# Patient Record
Sex: Female | Born: 1980
Health system: Southern US, Community
[De-identification: ages and names within clinical notes are randomized; demographics above are authoritative.]

## PROBLEM LIST (undated history)

## (undated) DIAGNOSIS — O139 Gestational [pregnancy-induced] hypertension without significant proteinuria, unspecified trimester: Secondary | ICD-10-CM

## (undated) DIAGNOSIS — G4730 Sleep apnea, unspecified: Secondary | ICD-10-CM

## (undated) DIAGNOSIS — E669 Obesity, unspecified: Secondary | ICD-10-CM

## (undated) DIAGNOSIS — J45909 Unspecified asthma, uncomplicated: Secondary | ICD-10-CM

## (undated) DIAGNOSIS — G4733 Obstructive sleep apnea (adult) (pediatric): Secondary | ICD-10-CM

## (undated) DIAGNOSIS — T7840XA Allergy, unspecified, initial encounter: Secondary | ICD-10-CM

## (undated) DIAGNOSIS — D219 Benign neoplasm of connective and other soft tissue, unspecified: Secondary | ICD-10-CM

## (undated) HISTORY — DX: Unspecified asthma, uncomplicated: J45.909

## (undated) HISTORY — DX: Obesity, unspecified: E66.9

## (undated) HISTORY — PX: CERVICAL CERCLAGE: SHX1329

## (undated) HISTORY — PX: DILATION AND CURETTAGE OF UTERUS: SHX78

## (undated) HISTORY — DX: Obstructive sleep apnea (adult) (pediatric): G47.33

## (undated) HISTORY — PX: OTHER SURGICAL HISTORY: SHX169

## (undated) HISTORY — DX: Sleep apnea, unspecified: G47.30

## (undated) HISTORY — DX: Allergy, unspecified, initial encounter: T78.40XA

---

## 2007-11-25 ENCOUNTER — Emergency Department: Payer: Self-pay | Admitting: Internal Medicine

## 2009-06-20 ENCOUNTER — Emergency Department (HOSPITAL_COMMUNITY): Admission: EM | Admit: 2009-06-20 | Discharge: 2009-06-20 | Payer: Self-pay | Admitting: Emergency Medicine

## 2014-03-28 HISTORY — PX: MYOMECTOMY: SHX85

## 2014-04-24 ENCOUNTER — Other Ambulatory Visit (HOSPITAL_COMMUNITY): Payer: Self-pay | Admitting: Obstetrics & Gynecology

## 2014-04-24 DIAGNOSIS — N979 Female infertility, unspecified: Secondary | ICD-10-CM

## 2014-04-28 ENCOUNTER — Ambulatory Visit (HOSPITAL_COMMUNITY)
Admission: RE | Admit: 2014-04-28 | Discharge: 2014-04-28 | Disposition: A | Discharge status: Home / Self Care | Payer: BLUE CROSS/BLUE SHIELD | Source: Ambulatory Visit | Attending: Obstetrics & Gynecology | Admitting: Obstetrics & Gynecology

## 2014-04-28 DIAGNOSIS — D259 Leiomyoma of uterus, unspecified: Secondary | ICD-10-CM | POA: Diagnosis not present

## 2014-04-28 DIAGNOSIS — N921 Excessive and frequent menstruation with irregular cycle: Secondary | ICD-10-CM | POA: Insufficient documentation

## 2014-04-28 DIAGNOSIS — N979 Female infertility, unspecified: Secondary | ICD-10-CM

## 2014-04-28 MED ORDER — IOHEXOL 300 MG/ML  SOLN
20.0000 mL | Freq: Once | INTRAMUSCULAR | Status: AC | PRN
  Administered 2014-04-28: 20 mL

## 2014-10-15 ENCOUNTER — Other Ambulatory Visit: Payer: Self-pay | Admitting: Obstetrics and Gynecology

## 2015-05-12 ENCOUNTER — Encounter: Payer: Self-pay | Admitting: Family Medicine

## 2015-05-12 DIAGNOSIS — J45909 Unspecified asthma, uncomplicated: Secondary | ICD-10-CM | POA: Insufficient documentation

## 2015-05-13 ENCOUNTER — Ambulatory Visit: Payer: Self-pay | Admitting: Family Medicine

## 2015-06-02 ENCOUNTER — Encounter: Payer: Self-pay | Admitting: Family Medicine

## 2015-06-02 ENCOUNTER — Ambulatory Visit (INDEPENDENT_AMBULATORY_CARE_PROVIDER_SITE_OTHER): Payer: BLUE CROSS/BLUE SHIELD | Admitting: Family Medicine

## 2015-06-02 VITALS — BP 134/70 | HR 72 | Temp 98.9°F | Resp 12 | Ht 67.5 in | Wt 315.0 lb

## 2015-06-02 DIAGNOSIS — Z9109 Other allergy status, other than to drugs and biological substances: Secondary | ICD-10-CM

## 2015-06-02 DIAGNOSIS — Z91048 Other nonmedicinal substance allergy status: Secondary | ICD-10-CM | POA: Diagnosis not present

## 2015-06-02 DIAGNOSIS — J4520 Mild intermittent asthma, uncomplicated: Secondary | ICD-10-CM

## 2015-06-02 DIAGNOSIS — Z0000 Encounter for general adult medical examination without abnormal findings: Secondary | ICD-10-CM | POA: Diagnosis not present

## 2015-06-02 DIAGNOSIS — E669 Obesity, unspecified: Secondary | ICD-10-CM | POA: Insufficient documentation

## 2015-06-02 MED ORDER — NALTREXONE-BUPROPION HCL ER 8-90 MG PO TB12: 2.0000 | ORAL_TABLET | Freq: Two times a day (BID) | ORAL | Status: DC

## 2015-06-02 MED ORDER — AZELASTINE HCL 0.1 % NA SOLN: 2.0000 | Freq: Two times a day (BID) | NASAL | Status: DC

## 2015-06-02 MED ORDER — MONTELUKAST SODIUM 10 MG PO TABS: 10.0000 mg | ORAL_TABLET | Freq: Every day | ORAL | Status: DC

## 2015-06-02 MED ORDER — FLUTICASONE PROPIONATE 50 MCG/ACT NA SUSP: 2.0000 | Freq: Every day | NASAL | Status: DC

## 2015-06-02 MED ORDER — EPINEPHRINE 0.3 MG/0.3ML IJ SOAJ: 0.3000 mg | Freq: Once | INTRAMUSCULAR | Status: DC

## 2015-06-02 NOTE — Patient Instructions (Addendum)
Release of records- [facility]  For Contrave-  Take 1 pill in the morning for 1 week Then 1 pill twice a day for 1 week  Then 2 pill in morning and 1 in evening for 1 ween Then 2 pills in morning and 2 in evening --stay at this dose  Singuilar for allergies  F/U 8 weeks

## 2015-06-02 NOTE — Progress Notes (Signed)
Patient ID: Carla [name], female   DOB: 1980-06-28, 35 y.o.   MRN: [id number]   Subjective:    Patient ID: Carla [name], female    DOB: 02-01-1981, 35 y.o.   MRN: [id number]  Patient presents for New Patient ~ Establish Care and Discuss Weight  patient to establish care. She has a GYN at [facility]. She's had a myomectomy done secondary to fibroids and heavy bleeding.  Her previous primary care provider within [place] that she did not follow with them regularly. She does have a history of asthma and allergies uses albuterol as needed she does use the antihistamines however these cause her to be very sleepy during the daytime. Most the time she uses Flonase or Astelin spray. Her main concern today is her weight. She's had difficulty with her weight most of her adult life. During high school in her early 62s she was about 160 270 pounds. After she had her daughter is when her weight started to increase around this time she was about 35 years old. She was treated with phentermine by her previous primary care provider but she does not recall why she went off of the medication. She does remember feeling the stimulant effects. 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 and she is also getting her PhD in biology so therefore her time is very restrained. Previously she was commuting an hour to work and back home in the morning and evenings. She admits that she has not been exercising or eating very healthy. She is I some help with her weight loss.    Review Of Systems:  GEN- denies fatigue, fever, weight loss,weakness, recent illness HEENT- denies eye drainage, change in vision, nasal discharge, CVS- denies chest pain, palpitations RESP- denies SOB, cough, wheeze ABD- denies N/V, change in stools, abd pain GU- denies dysuria, hematuria, dribbling, incontinence MSK- denies joint pain, muscle aches, injury Neuro- denies headache, dizziness, syncope, seizure activity       Objective:    BP 134/70  mmHg  Pulse 72  Temp(Src) 98.9 F (37.2 C) (Oral)  Resp 12  Ht 5' 7.5" (1.715 m)  Wt 315 lb (142.883 kg)  BMI 48.58 kg/m2  LMP 05/07/2015 (Approximate) GEN- NAD, alert and oriented x3,obese HEENT- PERRL, EOMI, non injected sclera, pink conjunctiva, MMM, oropharynx clear Neck- Supple, no thyromegaly CVS- RRR, no murmur RESP-CTAB ABD-NABS,soft,NT,ND Psych- Normal affect and mood EXT- No edema Pulses- Radial, DP- 2+        Assessment & Plan:      Problem List Items Addressed This Visit    None      Note: This dictation was prepared with Dragon dictation along with smaller phrase technology. Any transcriptional errors that result from this process are unintentional.

## 2015-06-03 ENCOUNTER — Encounter: Payer: Self-pay | Admitting: Family Medicine

## 2015-06-03 LAB — COMPREHENSIVE METABOLIC PANEL
ALK PHOS: 62 U/L (ref 33–115)
ALT: 17 U/L (ref 6–29)
AST: 17 U/L (ref 10–30)
Albumin: 4 g/dL (ref 3.6–5.1)
BUN: 11 mg/dL (ref 7–25)
CALCIUM: 9.2 mg/dL (ref 8.6–10.2)
CO2: 27 mmol/L (ref 20–31)
Chloride: 105 mmol/L (ref 98–110)
Creat: 0.78 mg/dL (ref 0.50–1.10)
Glucose, Bld: 80 mg/dL (ref 70–99)
POTASSIUM: 4.8 mmol/L (ref 3.5–5.3)
Sodium: 138 mmol/L (ref 135–146)
TOTAL PROTEIN: 6.7 g/dL (ref 6.1–8.1)
Total Bilirubin: 0.2 mg/dL (ref 0.2–1.2)

## 2015-06-03 LAB — CBC WITH DIFFERENTIAL/PLATELET
BASOS ABS: 0.1 10*3/uL (ref 0.0–0.1)
Basophils Relative: 1 % (ref 0–1)
Eosinophils Absolute: 0.1 10*3/uL (ref 0.0–0.7)
Eosinophils Relative: 2 % (ref 0–5)
HEMATOCRIT: 37.8 % (ref 36.0–46.0)
HEMOGLOBIN: 12.2 g/dL (ref 12.0–15.0)
Lymphocytes Relative: 34 % (ref 12–46)
Lymphs Abs: 1.7 10*3/uL (ref 0.7–4.0)
MCH: 29.6 pg (ref 26.0–34.0)
MCHC: 32.3 g/dL (ref 30.0–36.0)
MCV: 91.7 fL (ref 78.0–100.0)
MONOS PCT: 7 % (ref 3–12)
MPV: 10.2 fL (ref 8.6–12.4)
Monocytes Absolute: 0.4 10*3/uL (ref 0.1–1.0)
Neutro Abs: 2.8 10*3/uL (ref 1.7–7.7)
Neutrophils Relative %: 56 % (ref 43–77)
Platelets: 363 10*3/uL (ref 150–400)
RBC: 4.12 MIL/uL (ref 3.87–5.11)
RDW: 14.9 % (ref 11.5–15.5)
WBC: 5 10*3/uL (ref 4.0–10.5)

## 2015-06-03 LAB — TSH: TSH: 2.34 m[IU]/L

## 2015-06-03 NOTE — Assessment & Plan Note (Signed)
Trial of singulair, continue albuterol as needed

## 2015-06-03 NOTE — Assessment & Plan Note (Signed)
Multiple environmental allergens  Continue nasal spray. We'll try her on Singulair she has failed multiple antihistamines and they cause sedation.  She has EpiPen

## 2015-06-03 NOTE — Assessment & Plan Note (Signed)
His best weight loss options. I think she will be a good candidate for the Contrave discussed the side effects and how to taper this medication up. I will check basic labs to make sure those are normal. We will obtain her records from her GYN

## 2015-06-08 ENCOUNTER — Telehealth: Payer: Self-pay | Admitting: *Deleted

## 2015-06-08 NOTE — Telephone Encounter (Signed)
Received request from pharmacy for PA on Contrave.   PA submitted.   Dx: E66.01- obesity. BMI 48.7.

## 2015-06-09 NOTE — Telephone Encounter (Signed)
Received PA determination.   PA approved 06/08/2015- 10/06/2015.  Case #: [id number].  Pharmacy made aware.

## 2015-07-29 ENCOUNTER — Encounter: Payer: Self-pay | Admitting: Family Medicine

## 2015-07-29 ENCOUNTER — Ambulatory Visit (INDEPENDENT_AMBULATORY_CARE_PROVIDER_SITE_OTHER): Payer: BLUE CROSS/BLUE SHIELD | Admitting: Family Medicine

## 2015-07-29 MED ORDER — PHENTERMINE HCL 37.5 MG PO TABS: 37.5000 mg | ORAL_TABLET | Freq: Every day | ORAL | Status: DC

## 2015-07-29 NOTE — Progress Notes (Signed)
Patient ID: Carla [name], female   DOB: [birth year], 35 y.o.   MRN: [id number]    Subjective:    Patient ID: Carla [name], female    DOB: 03-25-1981, 35 y.o.   MRN: [id number]  Patient presents for 2 month F/U  Patient here for interim follow-up on obesity. She was started on contrary at the end of March however she has side effects with the medication discontinued after 3 days made her feel like she was going to pass out should a lot of congestion headache with the medication. She will like to try another medication she was on phentermine in the past but states after recalling she had some mild side effects from the stimulant where she felt jittery but she prefers to try this medication again. She is trying to watch her diet and trying to be active.   Review Of Systems:  GEN- denies fatigue, fever, weight loss,weakness, recent illness HEENT- denies eye drainage, change in vision, nasal discharge, CVS- denies chest pain, palpitations RESP- denies SOB, cough, wheeze ABD- denies N/V, change in stools, abd pain GU- denies dysuria, hematuria, dribbling, incontinence MSK- denies joint pain, muscle aches, injury Neuro- denies headache, dizziness, syncope, seizure activity       Objective:    BP 120/66 mmHg  Pulse 70  Temp(Src) 98.7 F (37.1 C) (Oral)  Resp 12  Ht 5\' 8"  (1.727 m)  Wt 312 lb (141.522 kg)  BMI 47.45 kg/m2  LMP 07/08/2015 (Approximate) GEN- NAD, alert and oriented x3 CVS- RRR, no murmur RESP-CTAB EXT- No edema Psych- normal affect and mood Pulses- Radial2+        Assessment & Plan:      Problem List Items Addressed This Visit    Morbid obesity ([place]) - Primary    We'll try phentermine was started with one half tablet daily for 2 weeks and increase to 1 full tablet. Discussed the side effects of the medication. Continue low-carb low-fat diet and try to incorporate aerobic exercise 5 days a week. Follow-up in 3 months for weight      Relevant Medications   phentermine (ADIPEX-P) 37.5 MG tablet      Note: This dictation was prepared with Dragon dictation along with smaller phrase technology. Any transcriptional errors that result from this process are unintentional.

## 2015-07-29 NOTE — Patient Instructions (Signed)
Try phetermine take 1/2 tablet for 2 weeks F/U 3 months

## 2015-07-29 NOTE — Assessment & Plan Note (Signed)
We'll try phentermine was started with one half tablet daily for 2 weeks and increase to 1 full tablet. Discussed the side effects of the medication. Continue low-carb low-fat diet and try to incorporate aerobic exercise 5 days a week. Follow-up in 3 months for weight

## 2015-09-08 ENCOUNTER — Telehealth: Payer: Self-pay | Admitting: Family Medicine

## 2015-09-08 ENCOUNTER — Ambulatory Visit (INDEPENDENT_AMBULATORY_CARE_PROVIDER_SITE_OTHER): Payer: BLUE CROSS/BLUE SHIELD | Admitting: Family Medicine

## 2015-09-08 ENCOUNTER — Encounter: Payer: Self-pay | Admitting: Family Medicine

## 2015-09-08 VITALS — BP 138/72 | HR 88 | Temp 98.9°F | Resp 14 | Ht 68.0 in | Wt 314.0 lb

## 2015-09-08 DIAGNOSIS — H60391 Other infective otitis externa, right ear: Secondary | ICD-10-CM

## 2015-09-08 MED ORDER — NEOMYCIN-POLYMYXIN-HC 3.5-10000-1 OT SOLN: 3.0000 [drp] | Freq: Four times a day (QID) | OTIC | Status: DC

## 2015-09-08 NOTE — Telephone Encounter (Signed)
Pt went to pick up her medication for her ear and it was almost $50. She is hoping that we can call in another medication that may be cheaper.

## 2015-09-08 NOTE — Telephone Encounter (Signed)
Call placed to pharmacy.   Was advised that prescription is the cheapest on the market.   MD please advise.

## 2015-09-08 NOTE — Telephone Encounter (Signed)
Call placed to patient. LMTRC.  

## 2015-09-08 NOTE — Patient Instructions (Signed)
Use ear drops as prescribed  F/U as previous

## 2015-09-08 NOTE — Telephone Encounter (Signed)
Okay to change to eye drops if cheaper

## 2015-09-08 NOTE — Progress Notes (Signed)
Patient ID: Carla [name], female   DOB: 1980/12/24, 35 y.o.   MRN: [id number]   Subjective:    Patient ID: Carla [name], female    DOB: 1981/01/21, 35 y.o.   MRN: [id number]  Patient presents for Illness Patient here with right ear pain worsening over the past 2 weeks. She is able to hear is normal. She's not had any drainage feels like her ears very irritated. Initially started with some itching in the ear. She did have a stomach bug couple weeks ago but that resolved. She has not had any sinus pressure or drainage she uses her regular allergy medicines and her asthma medicine as needed. She has not had any fever  She still working on weight loss. She is now on phentermine 1 tablet daily she is planning to incorporate a exercise program Review Of Systems:  GEN- denies fatigue, fever, weight loss,weakness, recent illness HEENT- denies eye drainage, change in vision, nasal discharge, CVS- denies chest pain, palpitations RESP- denies SOB, cough, wheeze ABD- denies N/V, change in stools, abd pain Neuro- denies headache, dizziness, syncope, seizure activity       Objective:    BP 138/72 mmHg  Pulse 88  Temp(Src) 98.9 F (37.2 C) (Oral)  Resp 14  Ht 5\' 8"  (1.727 m)  Wt 314 lb (142.429 kg)  BMI 47.75 kg/m2 GEN- NAD, alert and oriented x3 HEENT- PERRL, EOMI, non injected sclera, pink conjunctiva, MMM, oropharynx clear , TM clear bilat no effusion, injected right canal, no swelling no discharge, Tender to touch and with manipulation of pinna  No maxillary sinus tenderness,nares clear Neck- Supple, no LAD           Assessment & Plan:      Problem List Items Addressed This Visit    None    Visit Diagnoses    Otitis, externa, infective, right    -  Primary    cortisporin drops as prescribed       Note: This dictation was prepared with Dragon dictation along with smaller phrase technology. Any transcriptional errors that result from this process are unintentional.

## 2015-09-09 NOTE — Telephone Encounter (Signed)
Multiple calls placed to patient with no answer and no return call.   Message to be closed.  

## 2015-11-06 ENCOUNTER — Ambulatory Visit: Payer: BLUE CROSS/BLUE SHIELD | Admitting: Family Medicine

## 2016-03-03 LAB — OB RESULTS CONSOLE GC/CHLAMYDIA
Chlamydia: NEGATIVE
Gonorrhea: NEGATIVE

## 2016-03-23 ENCOUNTER — Encounter (HOSPITAL_COMMUNITY): Payer: Self-pay | Admitting: *Deleted

## 2016-03-24 ENCOUNTER — Encounter (HOSPITAL_COMMUNITY): Payer: Self-pay | Admitting: *Deleted

## 2016-04-04 LAB — OB RESULTS CONSOLE ANTIBODY SCREEN: Antibody Screen: NEGATIVE

## 2016-04-04 LAB — OB RESULTS CONSOLE HEPATITIS B SURFACE ANTIGEN: Hepatitis B Surface Ag: NEGATIVE

## 2016-04-04 LAB — OB RESULTS CONSOLE RPR: RPR: NONREACTIVE

## 2016-04-04 LAB — OB RESULTS CONSOLE ABO/RH: RH TYPE: POSITIVE

## 2016-04-04 LAB — OB RESULTS CONSOLE RUBELLA ANTIBODY, IGM: RUBELLA: IMMUNE

## 2016-04-04 LAB — OB RESULTS CONSOLE HIV ANTIBODY (ROUTINE TESTING): HIV: NONREACTIVE

## 2016-04-12 ENCOUNTER — Encounter (HOSPITAL_COMMUNITY): Payer: Self-pay | Admitting: Anesthesiology

## 2016-04-12 ENCOUNTER — Ambulatory Visit (HOSPITAL_COMMUNITY): Payer: Commercial Managed Care - PPO | Admitting: Anesthesiology

## 2016-04-12 ENCOUNTER — Ambulatory Visit (HOSPITAL_COMMUNITY)
Admission: RE | Admit: 2016-04-12 | Discharge: 2016-04-12 | Disposition: A | Discharge status: Home / Self Care | Payer: Commercial Managed Care - PPO | Source: Ambulatory Visit | Attending: Obstetrics and Gynecology | Admitting: Obstetrics and Gynecology

## 2016-04-12 ENCOUNTER — Ambulatory Visit (HOSPITAL_COMMUNITY): Payer: Commercial Managed Care - PPO

## 2016-04-12 ENCOUNTER — Encounter (HOSPITAL_COMMUNITY)
Admission: RE | Disposition: A | Discharge status: Home / Self Care | Payer: Self-pay | Source: Ambulatory Visit | Attending: Obstetrics and Gynecology

## 2016-04-12 DIAGNOSIS — O99511 Diseases of the respiratory system complicating pregnancy, first trimester: Secondary | ICD-10-CM | POA: Insufficient documentation

## 2016-04-12 DIAGNOSIS — E739 Lactose intolerance, unspecified: Secondary | ICD-10-CM | POA: Insufficient documentation

## 2016-04-12 DIAGNOSIS — O3431 Maternal care for cervical incompetence, first trimester: Secondary | ICD-10-CM | POA: Diagnosis present

## 2016-04-12 DIAGNOSIS — Z886 Allergy status to analgesic agent status: Secondary | ICD-10-CM | POA: Insufficient documentation

## 2016-04-12 DIAGNOSIS — J45909 Unspecified asthma, uncomplicated: Secondary | ICD-10-CM | POA: Diagnosis not present

## 2016-04-12 DIAGNOSIS — N883 Incompetence of cervix uteri: Secondary | ICD-10-CM

## 2016-04-12 DIAGNOSIS — Z6841 Body Mass Index (BMI) 40.0 and over, adult: Secondary | ICD-10-CM | POA: Diagnosis not present

## 2016-04-12 DIAGNOSIS — Z880 Allergy status to penicillin: Secondary | ICD-10-CM | POA: Insufficient documentation

## 2016-04-12 DIAGNOSIS — O09211 Supervision of pregnancy with history of pre-term labor, first trimester: Secondary | ICD-10-CM

## 2016-04-12 DIAGNOSIS — Z98891 History of uterine scar from previous surgery: Secondary | ICD-10-CM

## 2016-04-12 DIAGNOSIS — O09891 Supervision of other high risk pregnancies, first trimester: Secondary | ICD-10-CM

## 2016-04-12 DIAGNOSIS — Z3A12 12 weeks gestation of pregnancy: Secondary | ICD-10-CM | POA: Diagnosis not present

## 2016-04-12 DIAGNOSIS — Z9889 Other specified postprocedural states: Secondary | ICD-10-CM | POA: Insufficient documentation

## 2016-04-12 DIAGNOSIS — O3430 Maternal care for cervical incompetence, unspecified trimester: Secondary | ICD-10-CM

## 2016-04-12 HISTORY — PX: CERVICAL CERCLAGE: SHX1329

## 2016-04-12 HISTORY — DX: Benign neoplasm of connective and other soft tissue, unspecified: D21.9

## 2016-04-12 LAB — CBC
HEMATOCRIT: 36 % (ref 36.0–46.0)
HEMOGLOBIN: 12.6 g/dL (ref 12.0–15.0)
MCH: 31.8 pg (ref 26.0–34.0)
MCHC: 35 g/dL (ref 30.0–36.0)
MCV: 90.9 fL (ref 78.0–100.0)
Platelets: 274 10*3/uL (ref 150–400)
RBC: 3.96 MIL/uL (ref 3.87–5.11)
RDW: 14.5 % (ref 11.5–15.5)
WBC: 6.3 10*3/uL (ref 4.0–10.5)

## 2016-04-12 SURGERY — CERCLAGE, CERVIX, VAGINAL APPROACH
Anesthesia: Spinal | Site: Vagina

## 2016-04-12 MED ORDER — LACTATED RINGERS IV SOLN
INTRAVENOUS | Status: DC
  Administered 2016-04-12: 09:00:00 via INTRAVENOUS
  Administered 2016-04-12: 125 mL/h via INTRAVENOUS

## 2016-04-12 MED ORDER — HYDROCODONE-ACETAMINOPHEN 7.5-325 MG PO TABS: 1.0000 | ORAL_TABLET | Freq: Once | ORAL | Status: DC | PRN

## 2016-04-12 MED ORDER — FENTANYL CITRATE (PF) 100 MCG/2ML IJ SOLN
INTRAMUSCULAR | Status: AC
  Filled 2016-04-12: qty 2

## 2016-04-12 MED ORDER — LIDOCAINE-EPINEPHRINE 1 %-1:100000 IJ SOLN
INTRAMUSCULAR | Status: DC | PRN
  Administered 2016-04-12: 3 mL

## 2016-04-12 MED ORDER — MEPERIDINE HCL 25 MG/ML IJ SOLN: 6.2500 mg | INTRAMUSCULAR | Status: DC | PRN

## 2016-04-12 MED ORDER — FENTANYL CITRATE (PF) 100 MCG/2ML IJ SOLN
25.0000 ug | INTRAMUSCULAR | Status: DC | PRN
  Administered 2016-04-12: 25 ug via INTRAVENOUS

## 2016-04-12 MED ORDER — METOCLOPRAMIDE HCL 5 MG/ML IJ SOLN: 10.0000 mg | Freq: Once | INTRAMUSCULAR | Status: DC | PRN

## 2016-04-12 MED ORDER — INDOMETHACIN 50 MG PO CAPS: 50.0000 mg | ORAL_CAPSULE | Freq: Two times a day (BID) | ORAL | 0 refills | Status: DC

## 2016-04-12 MED ORDER — BUPIVACAINE IN DEXTROSE 0.75-8.25 % IT SOLN
INTRATHECAL | Status: DC | PRN
  Administered 2016-04-12: 1.2 mL via INTRATHECAL

## 2016-04-12 MED ORDER — LIDOCAINE-EPINEPHRINE 1 %-1:100000 IJ SOLN
INTRAMUSCULAR | Status: AC
  Filled 2016-04-12: qty 1

## 2016-04-12 MED ORDER — INDOMETHACIN 50 MG PO CAPS
50.0000 mg | ORAL_CAPSULE | Freq: Two times a day (BID) | ORAL | Status: DC
Start: 2016-04-12 — End: 2016-04-12
  Administered 2016-04-12: 50 mg via ORAL
  Filled 2016-04-12: qty 1

## 2016-04-12 MED ORDER — FENTANYL CITRATE (PF) 100 MCG/2ML IJ SOLN
INTRAMUSCULAR | Status: DC | PRN
  Administered 2016-04-12 (×2): 50 ug via INTRAVENOUS

## 2016-04-12 MED ORDER — ONDANSETRON HCL 4 MG/2ML IJ SOLN
INTRAMUSCULAR | Status: DC | PRN
  Administered 2016-04-12: 4 mg via INTRAVENOUS

## 2016-04-12 SURGICAL SUPPLY — 25 items
BLADE SURG 11 STRL SS (BLADE) ×2 IMPLANT
CANISTER SUCT 3000ML (MISCELLANEOUS) ×2 IMPLANT
CATH FOLEY 2WAY SLVR 30CC 16FR (CATHETERS) IMPLANT
CLOTH BEACON ORANGE TIMEOUT ST (SAFETY) ×2 IMPLANT
COUNTER NEEDLE 1200 MAGNETIC (NEEDLE) ×2 IMPLANT
ELECT REM PT RETURN 9FT ADLT (ELECTROSURGICAL) ×2
ELECTRODE REM PT RTRN 9FT ADLT (ELECTROSURGICAL) ×1 IMPLANT
GLOVE BIO SURGEON STRL SZ7 (GLOVE) ×2 IMPLANT
GLOVE BIOGEL PI IND STRL 7.0 (GLOVE) ×1 IMPLANT
GLOVE BIOGEL PI INDICATOR 7.0 (GLOVE) ×1
GOWN STRL REUS W/TWL LRG LVL3 (GOWN DISPOSABLE) ×4 IMPLANT
NEEDLE MAYO CATGUT SZ4 (NEEDLE) ×2 IMPLANT
NS IRRIG 1000ML POUR BTL (IV SOLUTION) ×2 IMPLANT
PACK VAGINAL MINOR WOMEN LF (CUSTOM PROCEDURE TRAY) ×2 IMPLANT
PAD OB MATERNITY 4.3X12.25 (PERSONAL CARE ITEMS) ×2 IMPLANT
PAD PREP 24X48 CUFFED NSTRL (MISCELLANEOUS) ×2 IMPLANT
PENCIL BUTTON HOLSTER BLD 10FT (ELECTRODE) ×2 IMPLANT
SUT TICRON 2 BLUE 36 GS-21 (SUTURE) ×4 IMPLANT
SUT VICRYL 3 0 RAPIDE (SUTURE) ×4 IMPLANT
SYR 30ML LL (SYRINGE) IMPLANT
TOWEL OR 17X24 6PK STRL BLUE (TOWEL DISPOSABLE) ×4 IMPLANT
TRAY FOLEY CATH SILVER 14FR (SET/KITS/TRAYS/PACK) ×2 IMPLANT
TUBING NON-CON 1/4 X 20 CONN (TUBING) ×2 IMPLANT
WATER STERILE IRR 1000ML POUR (IV SOLUTION) IMPLANT
YANKAUER SUCT BULB TIP NO VENT (SUCTIONS) ×2 IMPLANT

## 2016-04-12 NOTE — Anesthesia Procedure Notes (Signed)
Spinal  Patient location during procedure: OR Start time: 04/12/2016 9:29 AM Staffing Anesthesiologist: Josephine Igo Preanesthetic Checklist Completed: patient identified, site marked, surgical consent, pre-op evaluation, timeout performed, IV checked, risks and benefits discussed and monitors and equipment checked Spinal Block Patient position: sitting Prep: site prepped and draped and DuraPrep Patient monitoring: heart rate, cardiac monitor, continuous pulse ox and blood pressure Approach: midline Location: L3-4 Injection technique: single-shot Needle Needle type: Pencan and Sprotte  Needle gauge: 24 G Needle length: 12.7 cm Needle insertion depth: 11 cm Assessment Sensory level: T10 Additional Notes Patient tolerated procedure well. Adequate sensory block. Attempt x 2. Needed to use 12cm Sprotte to reach Intrathecal space.

## 2016-04-12 NOTE — Anesthesia Postprocedure Evaluation (Signed)
Anesthesia Post Note  Patient: Carla [name]  Procedure(s) Performed: Procedure(s) (LRB): CERCLAGE CERVICAL (N/A)  Patient location during evaluation: PACU Anesthesia Type: Spinal Level of consciousness: oriented and awake and alert Pain management: pain level controlled Vital Signs Assessment: post-procedure vital signs reviewed and stable Respiratory status: spontaneous breathing, respiratory function stable and nonlabored ventilation Cardiovascular status: blood pressure returned to baseline and stable Postop Assessment: no headache, no backache, spinal receding, patient able to bend at knees and no signs of nausea or vomiting Anesthetic complications: no        Last Vitals:  Vitals:   04/12/16 1100 04/12/16 1115  BP:  (!) 145/80  Pulse: 68 77  Resp: 20 (!) 23  Temp:      Last Pain:  Vitals:   04/12/16 1115  TempSrc:   PainSc: 0-No pain   Pain Goal: Patients Stated Pain Goal: 2 (04/12/16 1115)               Sadi Arave A.

## 2016-04-12 NOTE — Anesthesia Preprocedure Evaluation (Addendum)
Anesthesia Evaluation  Patient identified by MRN, date of birth, ID band Patient awake    Reviewed: Allergy & Precautions, NPO status , Patient's Chart, lab work & pertinent test results  Airway Mallampati: III  TM Distance: >3 FB Neck ROM: Full    Dental no notable dental hx. (+) Teeth Intact   Pulmonary asthma ,    Pulmonary exam normal breath sounds clear to auscultation- rhonchi       Cardiovascular negative cardio ROS Normal cardiovascular exam Rhythm:Regular Rate:Normal     Neuro/Psych negative neurological ROS  negative psych ROS   GI/Hepatic negative GI ROS, Neg liver ROS,   Endo/Other  Morbid obesity  Renal/GU negative Renal ROS  negative genitourinary   Musculoskeletal negative musculoskeletal ROS (+)   Abdominal (+) + obese,   Peds  Hematology negative hematology ROS (+)   Anesthesia Other Findings   Reproductive/Obstetrics (+) Pregnancy Incompetent Cervix Hx/o Myomectomy                            Anesthesia Physical Anesthesia Plan  ASA: III  Anesthesia Plan: Spinal   Post-op Pain Management:    Induction:   Airway Management Planned: Natural Airway  Additional Equipment:   Intra-op Plan:   Post-operative Plan:   Informed Consent: I have reviewed the patients History and Physical, chart, labs and discussed the procedure including the risks, benefits and alternatives for the proposed anesthesia with the patient or authorized representative who has indicated his/her understanding and acceptance.   Dental advisory given  Plan Discussed with: CRNA, Anesthesiologist and Surgeon  Anesthesia Plan Comments:         Anesthesia Quick Evaluation

## 2016-04-12 NOTE — Brief Op Note (Signed)
04/12/2016  10:16 AM  PATIENT:  Carla [name]  36 y.o. female  PRE-OPERATIVE DIAGNOSIS:  INCOMPETENT CERVIX  POST-OPERATIVE DIAGNOSIS:  incompetent cervix  PROCEDURE:  Procedure(s): CERCLAGE CERVICAL (N/A)  SURGEON:  Surgeon(s) and Role:    * Bobbye Charleston, MD - Primary  ANESTHESIA:   spinal  EBL:  No intake/output data recorded.  LOCAL MEDICATIONS USED:  LIDOCAINE   SPECIMEN:  No Specimen  DISPOSITION OF SPECIMEN:  N/A  COUNTS:  YES  TOURNIQUET:  * No tourniquets in log *  DICTATION: .Note written in EPIC  PLAN OF CARE: Discharge to home after PACU  PATIENT DISPOSITION:  PACU - hemodynamically stable.   Delay start of Pharmacological VTE agent (>24hrs) due to surgical blood loss or risk of bleeding: not applicable  Findings:normal cervical length and closed.  Technique:  After adequate spinal anesthesia was achieved the pt was examined. The pt was then prepped and draped in usual sterile fashion; a foley was placed and bladder emptied.  The cervix was grasped with a fenestrated rings and the bladder reflexion was injected with saline.  Saline was also injected in posterior reflexion in case as well.  The reflexion of the vagina onto the cervix was then tented up and carefully incised with the metzenbaums up another centimeter, staying close to the cervix and away from the bladder.  A 0 double loaded Ticron was then used to go from 12:00 to 9:00 and 9:00 to 6:00 with one end and then 12:00 to 3:00 and 3:00 to 6:00 with the other end.   A second stitch was placed in the same way just superior anteriorly to the previous stitch and then at least 2 cm superior on the posterior cervix.  Both stitches were tied down tight over a small os dilator and tied on the [place] with AIR KNOTs for identification.  The bladder flap was closed with a interupted stitch of 3-0 vicryl R.  The bladder was well out of the area of dissection during the procedure.  The pt tolerated the  procedure well and was returned to the recovery room in stable condition.

## 2016-04-12 NOTE — Op Note (Signed)
04/12/2016  10:16 AM  PATIENT:  Carla [name]  36 y.o. female  PRE-OPERATIVE DIAGNOSIS:  INCOMPETENT CERVIX  POST-OPERATIVE DIAGNOSIS:  incompetent cervix  PROCEDURE:  Procedure(s): CERCLAGE CERVICAL (N/A)  SURGEON:  Surgeon(s) and Role:    * Bobbye Charleston, MD - Primary  ANESTHESIA:   spinal  EBL:  No intake/output data recorded.  LOCAL MEDICATIONS USED:  LIDOCAINE   SPECIMEN:  No Specimen  DISPOSITION OF SPECIMEN:  N/A  COUNTS:  YES  TOURNIQUET:  * No tourniquets in log *  DICTATION: .Note written in EPIC  PLAN OF CARE: Discharge to home after PACU  PATIENT DISPOSITION:  PACU - hemodynamically stable.   Delay start of Pharmacological VTE agent (>24hrs) due to surgical blood loss or risk of bleeding: not applicable  Findings:normal cervical length and closed.  Technique:  After adequate spinal anesthesia was achieved the pt was examined. The pt was then prepped and draped in usual sterile fashion; a foley was placed and bladder emptied.  The cervix was grasped with a fenestrated rings and the bladder reflexion was injected with saline.  Saline was also injected in posterior reflexion in case as well.  The reflexion of the vagina onto the cervix was then tented up and carefully incised with the metzenbaums up another centimeter, staying close to the cervix and away from the bladder.  A 0 double loaded Ticron was then used to go from 12:00 to 9:00 and 9:00 to 6:00 with one end and then 12:00 to 3:00 and 3:00 to 6:00 with the other end.   A second stitch was placed in the same way just superior anteriorly to the previous stitch and then at least 2 cm superior on the posterior cervix.  Both stitches were tied down tight over a small os dilator and tied on the [place] with AIR KNOTs for identification.  The bladder flap was closed with a interupted stitch of 3-0 vicryl R.  The bladder was well out of the area of dissection during the procedure.  The pt tolerated the  procedure well and was returned to the recovery room in stable condition.

## 2016-04-12 NOTE — Transfer of Care (Signed)
Immediate Anesthesia Transfer of Care Note  Patient: Carla [name]  Procedure(s) Performed: Procedure(s): CERCLAGE CERVICAL (N/A)  Patient Location: PACU  Anesthesia Type:Spinal  Level of Consciousness: awake  Airway & Oxygen Therapy: Patient Spontanous Breathing  Post-op Assessment: Report given to RN and Post -op Vital signs reviewed and stable  Post vital signs: stable  Last Vitals:  Vitals:   04/12/16 0809  BP: (!) 145/90  Pulse: 80  Resp: 18  Temp: 36.9 C    Last Pain:  Vitals:   04/12/16 0809  TempSrc: Oral  PainSc: 0-No pain      Patients Stated Pain Goal: 2 ([id number])  Complications: No apparent anesthesia complications

## 2016-04-12 NOTE — H&P (Signed)
36 y.o. yo G4 P0121 at 26 5/7 weeks for cerclage for poor second trimester history. G1: 36 yo ETOP 13 weeks D&E G2: PTL delivery of 48 week old daughter G3: Ectopic pregnancy 2008 - Left salpingostomy G4: 20 week missed ab Patient had cerclage with G2/G4 NIPT this pregnancy is low risk female.  Pt has history of myomectomy.  Past Medical History:  Diagnosis Date  . Allergy   . Asthma   . Fibroids    Past Surgical History:  Procedure Laterality Date  . CERVICAL CERCLAGE  2003, 2008  . ectopic    . MYOMECTOMY  2016   fibroid removal    Social History   Social History  . Marital status: Married    Spouse name: N/A  . Number of children: N/A  . Years of education: N/A   Occupational History  . Not on file.   Social History Main Topics  . Smoking status: Never Smoker  . Smokeless tobacco: Never Used  . Alcohol use No  . Drug use: No  . Sexual activity: Yes    Birth control/ protection: None   Other Topics Concern  . Not on file   Social History Narrative  . No narrative on file    No current facility-administered medications on file prior to encounter.    Current Outpatient Prescriptions on File Prior to Encounter  Medication Sig Dispense Refill  . albuterol (PROVENTIL HFA;VENTOLIN HFA) 108 (90 Base) MCG/ACT inhaler Inhale 1-2 puffs into the lungs as needed for wheezing or shortness of breath (before exercise).    Marland Kitchen azelastine (ASTELIN) 0.1 % nasal spray Place 2 sprays into both nostrils 2 (two) times daily. Use in each nostril as directed (Patient taking differently: Place 2 sprays into both nostrils 2 (two) times daily as needed for allergies. ) 30 mL 3  . EPINEPHrine 0.3 mg/0.3 mL IJ SOAJ injection Inject 0.3 mLs (0.3 mg total) into the muscle once. (Patient taking differently: Inject 0.3 mg into the muscle as needed (allergic reactions). ) 1 Device 2    Allergies  Allergen Reactions  . Lactose Intolerance (Gi)     Stomach pain   . Penicillins Hives and Nausea And  Vomiting    Has patient had a PCN reaction causing immediate rash, facial/tongue/throat swelling, SOB or lightheadedness with hypotension: No Has patient had a PCN reaction causing severe rash involving mucus membranes or skin necrosis: Yes Has patient had a PCN reaction that required hospitalization No Has patient had a PCN reaction occurring within the last 10 years: No If all of the above answers are "NO", then may proceed with Cephalosporin use.   . Ibuprofen Palpitations    Vitals:   03/24/16 1549 04/12/16 0809  BP:  (!) 145/90  Pulse:  80  Resp:  18  Temp:  98.5 F (36.9 C)  TempSrc:  Oral  SpO2:  100%  Weight: (!) 142.9 kg (315 lb) (!) 142.9 kg (315 lb)  Height: 5\' 7"  (1.702 m) 5\' 7"  (1.702 m)    Lungs: clear to ascultation Cor:  RRR Abdomen:  soft, nontender, nondistended. Ex:  no cords, erythema Pelvic:  Close cervix in office.  [place] cervix was 4.28 cm.    A:  For cerclage.     P: All risks, benefits and alternatives d/w patient and she desires to proceed.  Patient will have SCDs during the operation.     Giovany Cosby A

## 2016-04-13 ENCOUNTER — Encounter (HOSPITAL_COMMUNITY): Payer: Self-pay | Admitting: Obstetrics and Gynecology

## 2016-05-26 ENCOUNTER — Encounter: Payer: Self-pay | Admitting: Family Medicine

## 2016-06-08 ENCOUNTER — Other Ambulatory Visit: Payer: Self-pay | Admitting: *Deleted

## 2016-06-08 MED ORDER — ALBUTEROL SULFATE HFA 108 (90 BASE) MCG/ACT IN AERS: 1.0000 | INHALATION_SPRAY | RESPIRATORY_TRACT | 3 refills | Status: DC | PRN

## 2016-07-25 ENCOUNTER — Encounter: Payer: Self-pay | Admitting: Family Medicine

## 2016-07-27 DIAGNOSIS — Z23 Encounter for immunization: Secondary | ICD-10-CM | POA: Diagnosis not present

## 2016-08-05 ENCOUNTER — Inpatient Hospital Stay (HOSPITAL_COMMUNITY)
Admission: AD | Admit: 2016-08-05 | Discharge: 2016-08-05 | Disposition: A | Discharge status: Home / Self Care | Payer: Commercial Managed Care - PPO | Source: Ambulatory Visit | Attending: Obstetrics and Gynecology | Admitting: Obstetrics and Gynecology

## 2016-08-05 ENCOUNTER — Encounter (HOSPITAL_COMMUNITY): Payer: Self-pay

## 2016-08-05 DIAGNOSIS — O26893 Other specified pregnancy related conditions, third trimester: Secondary | ICD-10-CM | POA: Diagnosis not present

## 2016-08-05 DIAGNOSIS — O133 Gestational [pregnancy-induced] hypertension without significant proteinuria, third trimester: Secondary | ICD-10-CM | POA: Diagnosis present

## 2016-08-05 DIAGNOSIS — J45909 Unspecified asthma, uncomplicated: Secondary | ICD-10-CM | POA: Insufficient documentation

## 2016-08-05 DIAGNOSIS — Z3A29 29 weeks gestation of pregnancy: Secondary | ICD-10-CM | POA: Insufficient documentation

## 2016-08-05 LAB — COMPREHENSIVE METABOLIC PANEL
ALT: 18 U/L (ref 14–54)
ANION GAP: 6 (ref 5–15)
AST: 19 U/L (ref 15–41)
Albumin: 3.4 g/dL — ABNORMAL LOW (ref 3.5–5.0)
Alkaline Phosphatase: 43 U/L (ref 38–126)
BUN: 10 mg/dL (ref 6–20)
CHLORIDE: 104 mmol/L (ref 101–111)
CO2: 23 mmol/L (ref 22–32)
CREATININE: 0.54 mg/dL (ref 0.44–1.00)
Calcium: 8.7 mg/dL — ABNORMAL LOW (ref 8.9–10.3)
Glucose, Bld: 92 mg/dL (ref 65–99)
Potassium: 3.9 mmol/L (ref 3.5–5.1)
SODIUM: 133 mmol/L — AB (ref 135–145)
Total Bilirubin: 0.2 mg/dL — ABNORMAL LOW (ref 0.3–1.2)
Total Protein: 6.7 g/dL (ref 6.5–8.1)

## 2016-08-05 LAB — URINALYSIS, ROUTINE W REFLEX MICROSCOPIC
BILIRUBIN URINE: NEGATIVE
Glucose, UA: NEGATIVE mg/dL
HGB URINE DIPSTICK: NEGATIVE
KETONES UR: NEGATIVE mg/dL
NITRITE: NEGATIVE
Protein, ur: 30 mg/dL — AB
SPECIFIC GRAVITY, URINE: 1.023 (ref 1.005–1.030)
pH: 7 (ref 5.0–8.0)

## 2016-08-05 LAB — CBC
HCT: 35 % — ABNORMAL LOW (ref 36.0–46.0)
Hemoglobin: 11.8 g/dL — ABNORMAL LOW (ref 12.0–15.0)
MCH: 31.7 pg (ref 26.0–34.0)
MCHC: 33.7 g/dL (ref 30.0–36.0)
MCV: 94.1 fL (ref 78.0–100.0)
Platelets: 256 10*3/uL (ref 150–400)
RBC: 3.72 MIL/uL — AB (ref 3.87–5.11)
RDW: 13.5 % (ref 11.5–15.5)
WBC: 5.3 10*3/uL (ref 4.0–10.5)

## 2016-08-05 LAB — PROTEIN / CREATININE RATIO, URINE
Creatinine, Urine: 158 mg/dL
Protein Creatinine Ratio: 0.11 mg/mg{Cre} (ref 0.00–0.15)
TOTAL PROTEIN, URINE: 17 mg/dL

## 2016-08-05 LAB — URIC ACID: URIC ACID, SERUM: 4.2 mg/dL (ref 2.3–6.6)

## 2016-08-05 LAB — LACTATE DEHYDROGENASE: LDH: 156 U/L (ref 98–192)

## 2016-08-05 NOTE — MAU Provider Note (Signed)
Patient Carla [name] is a 36 y.o. [id number] At [redacted]w[redacted]d Here after elevated blood pressures at her OB-GYN appointment this morning. She denies headache, blurry vision, epigastric pain, sudden swelling.  She is a repeat C-section. She complains of some shortness of breath that has been going on for at least a month.  She denies bleeding, contractions or decreased fetal movements.  History     CSN: [id number]  Arrival date and time: 08/05/16 1045   First Provider Initiated Contact with Patient 08/05/16 1123      Chief Complaint  Patient presents with  . Shortness of Breath  . Hypertension   HPI  OB History    Gravida Para Term Preterm AB Living   5 1 1   3 1    SAB TAB Ectopic Multiple Live Births   2   1          Past Medical History:  Diagnosis Date  . Allergy   . Asthma   . Fibroids     Past Surgical History:  Procedure Laterality Date  . CERVICAL CERCLAGE  2003, 2008  . CERVICAL CERCLAGE N/A 04/12/2016   Procedure: CERCLAGE CERVICAL;  Surgeon: Bobbye Charleston, MD;  Location: [facility];  Service: Gynecology;  Laterality: N/A;  . ectopic    . MYOMECTOMY  2016   fibroid removal    Family History  Problem Relation Age of Onset  . Diabetes Mother   . Hypertension Mother   . Alzheimer's disease Maternal Grandmother   . Cancer Maternal Grandfather   . Kidney disease Paternal Grandmother   . Cancer Paternal Grandfather   . Heart disease Maternal Aunt   . Learning disabilities Paternal Uncle     Social History  Substance Use Topics  . Smoking status: Never Smoker  . Smokeless tobacco: Never Used  . Alcohol use No    Allergies:  Allergies  Allergen Reactions  . Lactose Intolerance (Gi)     Stomach pain   . Penicillins Hives and Nausea And Vomiting    Has patient had a PCN reaction causing immediate rash, facial/tongue/throat swelling, SOB or lightheadedness with hypotension: No Has patient had a PCN reaction causing severe rash involving mucus membranes or skin  necrosis: Yes Has patient had a PCN reaction that required hospitalization No Has patient had a PCN reaction occurring within the last 10 years: No If all of the above answers are "NO", then may proceed with Cephalosporin use.   . Ibuprofen Palpitations    Prescriptions Prior to Admission  Medication Sig Dispense Refill Last Dose  . Prenatal Vit-Fe Fumarate-FA (PRENATAL MULTIVITAMIN) TABS tablet Take 1 tablet by mouth daily at 12 noon.   Past Week at Unknown time  . albuterol (PROVENTIL HFA;VENTOLIN HFA) 108 (90 Base) MCG/ACT inhaler Inhale 1-2 puffs into the lungs as needed for wheezing or shortness of breath (before exercise). 18 g 3 as needed  . indomethacin (INDOCIN) 50 MG capsule Take 1 capsule (50 mg total) by mouth 2 (two) times daily with a meal. (Patient not taking: Reported on 08/05/2016) 1 capsule 0 Not Taking at Unknown time    Review of Systems  Constitutional: Negative.   Respiratory: Positive for shortness of breath.   Cardiovascular: Negative.   Gastrointestinal: Negative.   Genitourinary: Negative.   Musculoskeletal: Negative.   Neurological: Negative.   Psychiatric/Behavioral: Negative.    Physical Exam   Blood pressure 132/73, pulse 88, temperature 98.9 F (37.2 C), temperature source Oral, resp. rate 18, height 5' 7.5" (1.715 m), weight Marland Kitchen)  338 lb (153.3 kg), SpO2 99 %.  Physical Exam  Constitutional: She is oriented to person, place, and time. She appears well-developed.  HENT:  Head: Normocephalic.  Neck: Normal range of motion.  Cardiovascular: Normal rate.   Respiratory: Effort normal.  GI: Soft. Bowel sounds are normal.  Musculoskeletal: Normal range of motion.  Neurological: She is alert and oriented to person, place, and time.  Skin: Skin is warm and dry.  Psychiatric: She has a normal mood and affect.    MAU Course  Procedures  MDM NST: 140 bpm, reactive with moderate variability, present acels, negative decels, mild uterine  irratability Pre-E labs are normal Results for orders placed or performed during the hospital encounter of 08/05/16 (from the past 24 hour(s))  Urinalysis, Routine w reflex microscopic     Status: Abnormal   Collection Time: 08/05/16 10:55 AM  Result Value Ref Range   Color, Urine YELLOW YELLOW   APPearance HAZY (A) CLEAR   Specific Gravity, Urine 1.023 1.005 - 1.030   pH 7.0 5.0 - 8.0   Glucose, UA NEGATIVE NEGATIVE mg/dL   Hgb urine dipstick NEGATIVE NEGATIVE   Bilirubin Urine NEGATIVE NEGATIVE   Ketones, ur NEGATIVE NEGATIVE mg/dL   Protein, ur 30 (A) NEGATIVE mg/dL   Nitrite NEGATIVE NEGATIVE   Leukocytes, UA LARGE (A) NEGATIVE   RBC / HPF 0-5 0 - 5 RBC/hpf   WBC, UA 0-5 0 - 5 WBC/hpf   Bacteria, UA FEW (A) NONE SEEN   Squamous Epithelial / LPF 6-30 (A) NONE SEEN   Mucous PRESENT   Protein / creatinine ratio, urine     Status: None   Collection Time: 08/05/16 10:55 AM  Result Value Ref Range   Creatinine, Urine 158.00 mg/dL   Total Protein, Urine 17 mg/dL   Protein Creatinine Ratio 0.11 0.00 - 0.15 mg/mg[Cre]  CBC     Status: Abnormal   Collection Time: 08/05/16 11:12 AM  Result Value Ref Range   WBC 5.3 4.0 - 10.5 K/uL   RBC 3.72 (L) 3.87 - 5.11 MIL/uL   Hemoglobin 11.8 (L) 12.0 - 15.0 g/dL   HCT 35.0 (L) 36.0 - 46.0 %   MCV 94.1 78.0 - 100.0 fL   MCH 31.7 26.0 - 34.0 pg   MCHC 33.7 30.0 - 36.0 g/dL   RDW 13.5 11.5 - 15.5 %   Platelets 256 150 - 400 K/uL  Comprehensive metabolic panel     Status: Abnormal   Collection Time: 08/05/16 11:12 AM  Result Value Ref Range   Sodium 133 (L) 135 - 145 mmol/L   Potassium 3.9 3.5 - 5.1 mmol/L   Chloride 104 101 - 111 mmol/L   CO2 23 22 - 32 mmol/L   Glucose, Bld 92 65 - 99 mg/dL   BUN 10 6 - 20 mg/dL   Creatinine, Ser 0.54 0.44 - 1.00 mg/dL   Calcium 8.7 (L) 8.9 - 10.3 mg/dL   Total Protein 6.7 6.5 - 8.1 g/dL   Albumin 3.4 (L) 3.5 - 5.0 g/dL   AST 19 15 - 41 U/L   ALT 18 14 - 54 U/L   Alkaline Phosphatase 43 38 - 126  U/L   Total Bilirubin 0.2 (L) 0.3 - 1.2 mg/dL   GFR calc non Af Amer >60 >60 mL/min   GFR calc Af Amer >60 >60 mL/min   Anion gap 6 5 - 15  Uric acid     Status: None   Collection Time: 08/05/16 11:12 AM  Result Value Ref Range   Uric Acid, Serum 4.2 2.3 - 6.6 mg/dL  Lactate dehydrogenase     Status: None   Collection Time: 08/05/16 11:12 AM  Result Value Ref Range   LDH 156 98 - 192 U/L    Assessment and Plan   1. Transient hypertension of pregnancy in third trimester    2. Reviewed patients lab findings, physical exam and history with Dr. Harrington Challenger. Per Dr. Harrington Challenger, patient stable for discharge with instructions to call Dr. Harrington Challenger over the weekend if she developes in any intractable headaches, sudden swelling, epigastric pain, blurry vision or floating spots. Patient to follow up with a blood pressure check on Monday in the office.   3. Reviewed when to return to the MAU (bleeding, leaking of fluid, decreased fetal movements, signs and symptoms as discussed above). All questions answered.  Mervyn Skeeters Kooistra 08/05/2016, 12:00 PM

## 2016-08-05 NOTE — MAU Note (Signed)
Pt sent over from MD office for Pre-eclampsia evaluation. Pt states her blood pressure has been up and down since about 20 weeks in her pregnancy. Pt states her blood pressure has been consistently elevated since the beginning of May. Pt states the baby is moving normally. Pt denies bleeding, leaking, and contractions. Pt c/o shortness of breath that started this month. Pt denies headache and blurred vision today.

## 2016-08-05 NOTE — Discharge Instructions (Signed)
Hypertension During Pregnancy Hypertension is also called high blood pressure. High blood pressure means that the force of your blood moving in your body is too strong. When you are pregnant, this condition should be watched carefully. It can cause problems for you and your baby. Follow these instructions at home: Eating and drinking  Drink enough fluid to keep your pee (urine) clear or pale yellow.  Eat healthy foods that are low in salt (sodium). ? Do not add salt to your food. ? Check labels on foods and drinks to see much salt is in them. Look on the label where you see "Sodium." Lifestyle  Do not use any products that contain nicotine or tobacco, such as cigarettes and e-cigarettes. If you need help quitting, ask your doctor.  Do not use alcohol.  Avoid caffeine.  Avoid stress. Rest and get plenty of sleep. General instructions  Take over-the-counter and prescription medicines only as told by your doctor.  While lying down, lie on your left side. This keeps pressure off your baby.  While sitting or lying down, raise (elevate) your feet. Try putting some pillows under your lower legs.  Exercise regularly. Ask your doctor what kinds of exercise are best for you.  Keep all prenatal and follow-up visits as told by your doctor. This is important. Contact a doctor if:  You have symptoms that your doctor told you to watch for, such as: ? Fever. ? Throwing up (vomiting). ? Headache. Get help right away if:  You have very bad pain in your belly (abdomen).  You are throwing up, and this does not get better with treatment.  You suddenly get swelling in your hands, ankles, or face.  You gain 4 lb (1.8 kg) or more in 1 week.  You get bleeding from your vagina.  You have blood in your pee.  You do not feel your baby moving as much as normal.  You have a change in vision.  You have muscle twitching or sudden tightening (spasms).  You have trouble breathing.  Your lips  or fingernails turn blue. This information is not intended to replace advice given to you by your health care provider. Make sure you discuss any questions you have with your health care provider. Document Released: 04/16/2010 Document Revised: 11/24/2015 Document Reviewed: 11/24/2015 Elsevier Interactive Patient Education  2017 Elsevier Inc.  

## 2016-08-07 ENCOUNTER — Encounter (HOSPITAL_COMMUNITY)
Admission: RE | Disposition: A | Discharge status: Home / Self Care | Payer: Self-pay | Source: Ambulatory Visit | Attending: Obstetrics & Gynecology

## 2016-08-07 SURGERY — Surgical Case
Anesthesia: Regional

## 2016-08-26 ENCOUNTER — Encounter (HOSPITAL_COMMUNITY): Payer: Self-pay

## 2016-08-26 ENCOUNTER — Ambulatory Visit (HOSPITAL_COMMUNITY)
Admission: RE | Admit: 2016-08-26 | Discharge: 2016-08-26 | Disposition: A | Discharge status: Home / Self Care | Payer: Commercial Managed Care - PPO | Source: Ambulatory Visit | Attending: Obstetrics & Gynecology | Admitting: Obstetrics & Gynecology

## 2016-08-26 DIAGNOSIS — O10013 Pre-existing essential hypertension complicating pregnancy, third trimester: Secondary | ICD-10-CM | POA: Diagnosis present

## 2016-08-26 DIAGNOSIS — O10919 Unspecified pre-existing hypertension complicating pregnancy, unspecified trimester: Secondary | ICD-10-CM | POA: Insufficient documentation

## 2016-08-26 DIAGNOSIS — Z9889 Other specified postprocedural states: Secondary | ICD-10-CM | POA: Insufficient documentation

## 2016-08-26 DIAGNOSIS — O09213 Supervision of pregnancy with history of pre-term labor, third trimester: Secondary | ICD-10-CM | POA: Insufficient documentation

## 2016-08-26 DIAGNOSIS — O3433 Maternal care for cervical incompetence, third trimester: Secondary | ICD-10-CM | POA: Diagnosis not present

## 2016-08-26 DIAGNOSIS — Z3A30 30 weeks gestation of pregnancy: Secondary | ICD-10-CM | POA: Diagnosis not present

## 2016-08-26 DIAGNOSIS — Z886 Allergy status to analgesic agent status: Secondary | ICD-10-CM | POA: Insufficient documentation

## 2016-08-26 DIAGNOSIS — Z880 Allergy status to penicillin: Secondary | ICD-10-CM | POA: Insufficient documentation

## 2016-08-26 DIAGNOSIS — O09523 Supervision of elderly multigravida, third trimester: Secondary | ICD-10-CM | POA: Diagnosis not present

## 2016-08-26 DIAGNOSIS — O3429 Maternal care due to uterine scar from other previous surgery: Secondary | ICD-10-CM | POA: Insufficient documentation

## 2016-08-26 DIAGNOSIS — O09893 Supervision of other high risk pregnancies, third trimester: Secondary | ICD-10-CM | POA: Insufficient documentation

## 2016-08-26 DIAGNOSIS — O99213 Obesity complicating pregnancy, third trimester: Secondary | ICD-10-CM

## 2016-08-26 NOTE — Progress Notes (Signed)
Maternal Fetal Medicine Consultation  Requesting Provider(s): Pinn  Primary OB: Pinn Reason for consultation: Elevated BP 36yo [id number] with intermittently elevated BP. She was ordered labetalol but due to the lability of her BP she never began the medication. She has had a normal 24h urine and has no PIH symptoms. She has had a previous 25+6 week delivery and has a cerclage in place. She has had a previous full-thickness myomectomy and states that you are planning delivery at 6 weeks She has advanced maternal age and had low-risk NIPT  HPI: OB History: OB History    Gravida Para Term Preterm AB Living   5 1 0 1 3 1    SAB TAB Ectopic Multiple Live Births   2   1          PMH:  Past Medical History:  Diagnosis Date  . Allergy   . Asthma   . Fibroids     PSH:  Past Surgical History:  Procedure Laterality Date  . CERVICAL CERCLAGE  2003, 2008  . CERVICAL CERCLAGE N/A 04/12/2016   Procedure: CERCLAGE CERVICAL;  Surgeon: Bobbye Charleston, MD;  Location: [facility];  Service: Gynecology;  Laterality: N/A;  . DILATION AND CURETTAGE OF UTERUS    . ectopic    . MYOMECTOMY  2016   fibroid removal   Meds: PNV, PRN albuterol inhaler Allergies: Penicillin, NSAIDs FH: See EPIC section on family history Soc: Denies alcohol, illicit drug and tobacco use  Review of Systems: no vaginal bleeding or cramping/contractions, no LOF, no nausea/vomiting. All other systems reviewed and are negative.  PNL: all normal   PE: See EPIC section on physical exam  A/P: 1. AMA: with age <40 years and low risk NIPT no interventions are indicated 2. History of preterm delivery: with cerclage in situ and EGA of 32+ weeks no interventions to recommend 3. History of full-thickness myomectomy: I agree with your plan for delivery no later than 37 weeks. It is acceptable to consider delivery at 36 weeks, especially if she becomes more symptomatic over the next 4 weeks 4. Labile blood pressures: the patient's  history for chronic hypertension is difficult to assess. She has labile Bps during this pregnancy, but has no PIH symptoms and labs are normal. I would not intervene unless BPs are >150/90 over the course of a day. If that happens I wouold admit, perform a PIH workup and begin antihypertensive treatment if the [facility] workup is negative  Thank you for the opportunity to be a part of the care of [facility]. Please contact our office if we can be of further assistance.   I spent approximately 30 minutes with this patient with over 50% of time spent in face-to-face counseling.

## 2016-09-07 ENCOUNTER — Other Ambulatory Visit: Payer: Self-pay | Admitting: Obstetrics & Gynecology

## 2016-09-09 ENCOUNTER — Encounter (HOSPITAL_COMMUNITY): Payer: Self-pay

## 2016-09-09 ENCOUNTER — Telehealth (HOSPITAL_COMMUNITY): Payer: Self-pay | Admitting: *Deleted

## 2016-09-09 NOTE — Telephone Encounter (Signed)
,  Preadmission screen  

## 2016-09-12 ENCOUNTER — Encounter (HOSPITAL_COMMUNITY): Payer: Self-pay

## 2016-09-19 ENCOUNTER — Encounter: Payer: Self-pay | Admitting: Family Medicine

## 2016-09-19 DIAGNOSIS — R030 Elevated blood-pressure reading, without diagnosis of hypertension: Secondary | ICD-10-CM | POA: Diagnosis not present

## 2016-09-21 ENCOUNTER — Other Ambulatory Visit: Payer: Self-pay | Admitting: Obstetrics & Gynecology

## 2016-09-22 ENCOUNTER — Encounter (HOSPITAL_COMMUNITY)
Admission: RE | Admit: 2016-09-22 | Discharge: 2016-09-22 | Disposition: A | Discharge status: Home / Self Care | Payer: Commercial Managed Care - PPO | Source: Ambulatory Visit | Attending: Obstetrics & Gynecology | Admitting: Obstetrics & Gynecology

## 2016-09-22 DIAGNOSIS — O9963 Diseases of the digestive system complicating the puerperium: Secondary | ICD-10-CM | POA: Diagnosis not present

## 2016-09-22 DIAGNOSIS — O3433 Maternal care for cervical incompetence, third trimester: Secondary | ICD-10-CM | POA: Diagnosis present

## 2016-09-22 DIAGNOSIS — O134 Gestational [pregnancy-induced] hypertension without significant proteinuria, complicating childbirth: Secondary | ICD-10-CM | POA: Diagnosis present

## 2016-09-22 DIAGNOSIS — R1110 Vomiting, unspecified: Secondary | ICD-10-CM | POA: Diagnosis not present

## 2016-09-22 DIAGNOSIS — Z3A36 36 weeks gestation of pregnancy: Secondary | ICD-10-CM | POA: Diagnosis not present

## 2016-09-22 DIAGNOSIS — O99214 Obesity complicating childbirth: Secondary | ICD-10-CM | POA: Diagnosis present

## 2016-09-22 DIAGNOSIS — Z6841 Body Mass Index (BMI) 40.0 and over, adult: Secondary | ICD-10-CM | POA: Diagnosis not present

## 2016-09-22 DIAGNOSIS — R109 Unspecified abdominal pain: Secondary | ICD-10-CM | POA: Diagnosis not present

## 2016-09-22 DIAGNOSIS — K567 Ileus, unspecified: Secondary | ICD-10-CM | POA: Diagnosis not present

## 2016-09-22 DIAGNOSIS — O3429 Maternal care due to uterine scar from other previous surgery: Secondary | ICD-10-CM | POA: Diagnosis present

## 2016-09-22 DIAGNOSIS — O3413 Maternal care for benign tumor of corpus uteri, third trimester: Secondary | ICD-10-CM | POA: Diagnosis present

## 2016-09-22 DIAGNOSIS — D252 Subserosal leiomyoma of uterus: Secondary | ICD-10-CM | POA: Diagnosis present

## 2016-09-22 DIAGNOSIS — R112 Nausea with vomiting, unspecified: Secondary | ICD-10-CM | POA: Diagnosis not present

## 2016-09-22 HISTORY — DX: Gestational (pregnancy-induced) hypertension without significant proteinuria, unspecified trimester: O13.9

## 2016-09-22 LAB — CBC
HCT: 35.5 % — ABNORMAL LOW (ref 36.0–46.0)
Hemoglobin: 12 g/dL (ref 12.0–15.0)
MCH: 31.4 pg (ref 26.0–34.0)
MCHC: 33.8 g/dL (ref 30.0–36.0)
MCV: 92.9 fL (ref 78.0–100.0)
Platelets: 245 10*3/uL (ref 150–400)
RBC: 3.82 MIL/uL — ABNORMAL LOW (ref 3.87–5.11)
RDW: 14 % (ref 11.5–15.5)
WBC: 5.9 10*3/uL (ref 4.0–10.5)

## 2016-09-22 LAB — ABO/RH: ABO/RH(D): AB POS

## 2016-09-22 LAB — TYPE AND SCREEN
ABO/RH(D): AB POS
ANTIBODY SCREEN: NEGATIVE

## 2016-09-22 NOTE — Patient Instructions (Signed)
[place]  09/22/2016   Your procedure is scheduled on:  09/23/2016  Enter through the Main Entrance of [facility] at [place] up the phone at the desk and dial [phone].   Call this number if you have problems the morning of surgery: [phone]   Remember:   Do not eat food:After Midnight.  Do not drink clear liquids: After Midnight.  Take these medicines the morning of surgery with A SIP OF WATER: none   Do not wear jewelry, make-up or nail polish.  Do not wear lotions, powders, or perfumes. Do not wear deodorant.  Do not shave 48 hours prior to surgery.  Do not bring valuables to the hospital.  [facility] is not   responsible for any belongings or valuables brought to the hospital.  Contacts, dentures or bridgework may not be worn into surgery.  Leave suitcase in the car. After surgery it may be brought to your room.  For patients admitted to the hospital, checkout time is 11:00 AM the day of              discharge.   Patients discharged the day of surgery will not be allowed to drive             home.  Name and phone number of your driver: na  Special Instructions:   N/A   Please read over the following fact sheets that you were given:   Surgical Site Infection Prevention

## 2016-09-22 NOTE — Anesthesia Preprocedure Evaluation (Addendum)
Anesthesia Evaluation  Patient identified by MRN, date of birth, ID band Patient awake    Reviewed: Allergy & Precautions, NPO status , Patient's Chart, lab work & pertinent test results  History of Anesthesia Complications Negative for: history of anesthetic complications  Airway Mallampati: III  TM Distance: >3 FB Neck ROM: Full    Dental no notable dental hx. (+) Dental Advisory Given   Pulmonary asthma ,    Pulmonary exam normal        Cardiovascular hypertension, Normal cardiovascular exam     Neuro/Psych negative neurological ROS     GI/Hepatic negative GI ROS, Neg liver ROS,   Endo/Other  Morbid obesity  Renal/GU negative Renal ROS     Musculoskeletal negative musculoskeletal ROS (+)   Abdominal   Peds  Hematology negative hematology ROS (+)   Anesthesia Other Findings Day of surgery medications reviewed with the patient.  Reproductive/Obstetrics (+) Pregnancy                            Anesthesia Physical Anesthesia Plan  ASA: III  Anesthesia Plan: Spinal   Post-op Pain Management:    Induction:   PONV Risk Score and Plan:   Airway Management Planned: Natural Airway  Additional Equipment:   Intra-op Plan:   Post-operative Plan:   Informed Consent: I have reviewed the patients History and Physical, chart, labs and discussed the procedure including the risks, benefits and alternatives for the proposed anesthesia with the patient or authorized representative who has indicated his/her understanding and acceptance.   Dental advisory given  Plan Discussed with: Anesthesiologist, CRNA and Surgeon  Anesthesia Plan Comments:        Anesthesia Quick Evaluation

## 2016-09-23 ENCOUNTER — Inpatient Hospital Stay (HOSPITAL_COMMUNITY)
Admission: RE | Admit: 2016-09-23 | Discharge: 2016-09-26 | DRG: 765 | Disposition: A | Discharge status: Home / Self Care | Payer: Commercial Managed Care - PPO | Source: Ambulatory Visit | Attending: Obstetrics & Gynecology | Admitting: Obstetrics & Gynecology

## 2016-09-23 ENCOUNTER — Encounter (HOSPITAL_COMMUNITY)
Admission: RE | Disposition: A | Discharge status: Home / Self Care | Payer: Self-pay | Source: Ambulatory Visit | Attending: Obstetrics & Gynecology

## 2016-09-23 ENCOUNTER — Inpatient Hospital Stay (HOSPITAL_COMMUNITY): Payer: Commercial Managed Care - PPO | Admitting: Anesthesiology

## 2016-09-23 ENCOUNTER — Encounter (HOSPITAL_COMMUNITY): Payer: Self-pay | Admitting: *Deleted

## 2016-09-23 DIAGNOSIS — O9963 Diseases of the digestive system complicating the puerperium: Secondary | ICD-10-CM | POA: Diagnosis not present

## 2016-09-23 DIAGNOSIS — R109 Unspecified abdominal pain: Secondary | ICD-10-CM | POA: Diagnosis not present

## 2016-09-23 DIAGNOSIS — R112 Nausea with vomiting, unspecified: Secondary | ICD-10-CM | POA: Diagnosis not present

## 2016-09-23 DIAGNOSIS — Z98891 History of uterine scar from previous surgery: Secondary | ICD-10-CM

## 2016-09-23 DIAGNOSIS — O134 Gestational [pregnancy-induced] hypertension without significant proteinuria, complicating childbirth: Secondary | ICD-10-CM | POA: Diagnosis present

## 2016-09-23 DIAGNOSIS — O3413 Maternal care for benign tumor of corpus uteri, third trimester: Secondary | ICD-10-CM | POA: Diagnosis present

## 2016-09-23 DIAGNOSIS — Z6841 Body Mass Index (BMI) 40.0 and over, adult: Secondary | ICD-10-CM

## 2016-09-23 DIAGNOSIS — K567 Ileus, unspecified: Secondary | ICD-10-CM | POA: Diagnosis not present

## 2016-09-23 DIAGNOSIS — D252 Subserosal leiomyoma of uterus: Secondary | ICD-10-CM | POA: Diagnosis present

## 2016-09-23 DIAGNOSIS — O3433 Maternal care for cervical incompetence, third trimester: Secondary | ICD-10-CM | POA: Diagnosis present

## 2016-09-23 DIAGNOSIS — G8918 Other acute postprocedural pain: Secondary | ICD-10-CM

## 2016-09-23 DIAGNOSIS — Z3A36 36 weeks gestation of pregnancy: Secondary | ICD-10-CM

## 2016-09-23 DIAGNOSIS — O99214 Obesity complicating childbirth: Secondary | ICD-10-CM | POA: Diagnosis present

## 2016-09-23 DIAGNOSIS — R1110 Vomiting, unspecified: Secondary | ICD-10-CM | POA: Diagnosis not present

## 2016-09-23 DIAGNOSIS — O3429 Maternal care due to uterine scar from other previous surgery: Principal | ICD-10-CM | POA: Diagnosis present

## 2016-09-23 HISTORY — PX: CERVICAL CERCLAGE: SHX1329

## 2016-09-23 LAB — COMPREHENSIVE METABOLIC PANEL
ALK PHOS: 56 U/L (ref 38–126)
ALT: 24 U/L (ref 14–54)
AST: 30 U/L (ref 15–41)
Albumin: 3.3 g/dL — ABNORMAL LOW (ref 3.5–5.0)
Anion gap: 7 (ref 5–15)
BILIRUBIN TOTAL: 0.6 mg/dL (ref 0.3–1.2)
BUN: 12 mg/dL (ref 6–20)
CALCIUM: 8.8 mg/dL — AB (ref 8.9–10.3)
CO2: 21 mmol/L — ABNORMAL LOW (ref 22–32)
CREATININE: 0.52 mg/dL (ref 0.44–1.00)
Chloride: 105 mmol/L (ref 101–111)
Glucose, Bld: 92 mg/dL (ref 65–99)
Potassium: 4.6 mmol/L (ref 3.5–5.1)
Sodium: 133 mmol/L — ABNORMAL LOW (ref 135–145)
TOTAL PROTEIN: 6.3 g/dL — AB (ref 6.5–8.1)

## 2016-09-23 LAB — RPR: RPR: NONREACTIVE

## 2016-09-23 SURGERY — Surgical Case
Anesthesia: Spinal | Site: Vagina

## 2016-09-23 MED ORDER — ALBUMIN HUMAN 5 % IV SOLN
INTRAVENOUS | Status: AC
  Filled 2016-09-23: qty 250

## 2016-09-23 MED ORDER — DIPHENHYDRAMINE HCL 50 MG/ML IJ SOLN: 12.5000 mg | INTRAMUSCULAR | Status: DC | PRN

## 2016-09-23 MED ORDER — MENTHOL 3 MG MT LOZG: 1.0000 | LOZENGE | OROMUCOSAL | Status: DC | PRN

## 2016-09-23 MED ORDER — OXYCODONE HCL 5 MG PO TABS: 5.0000 mg | ORAL_TABLET | ORAL | Status: DC | PRN

## 2016-09-23 MED ORDER — ALBUMIN HUMAN 5 % IV SOLN
INTRAVENOUS | Status: DC | PRN
  Administered 2016-09-23: 13:00:00 via INTRAVENOUS

## 2016-09-23 MED ORDER — OXYTOCIN 10 UNIT/ML IJ SOLN
INTRAMUSCULAR | Status: AC
Start: 2016-09-23 — End: 2016-09-23
  Filled 2016-09-23: qty 4

## 2016-09-23 MED ORDER — SIMETHICONE 80 MG PO CHEW
80.0000 mg | CHEWABLE_TABLET | Freq: Three times a day (TID) | ORAL | Status: DC
  Administered 2016-09-24 (×2): 80 mg via ORAL
  Filled 2016-09-23 (×2): qty 1

## 2016-09-23 MED ORDER — MEPERIDINE HCL 25 MG/ML IJ SOLN
INTRAMUSCULAR | Status: DC | PRN
  Administered 2016-09-23 (×2): 12.5 mg via INTRAVENOUS

## 2016-09-23 MED ORDER — BUPIVACAINE IN DEXTROSE 0.75-8.25 % IT SOLN
INTRATHECAL | Status: DC | PRN
  Administered 2016-09-23: 1.6 mL via INTRATHECAL

## 2016-09-23 MED ORDER — GENTAMICIN SULFATE 40 MG/ML IJ SOLN
INTRAMUSCULAR | Status: AC
  Administered 2016-09-23: 118 mL via INTRAVENOUS
  Filled 2016-09-23: qty 12.25

## 2016-09-23 MED ORDER — PHENYLEPHRINE HCL 10 MG/ML IJ SOLN
INTRAMUSCULAR | Status: DC | PRN
  Administered 2016-09-23: 40 ug via INTRAVENOUS
  Administered 2016-09-23 (×4): 80 ug via INTRAVENOUS

## 2016-09-23 MED ORDER — HYDROMORPHONE HCL 1 MG/ML IJ SOLN: 0.2500 mg | INTRAMUSCULAR | Status: DC | PRN

## 2016-09-23 MED ORDER — ONDANSETRON HCL 4 MG/2ML IJ SOLN
4.0000 mg | Freq: Three times a day (TID) | INTRAMUSCULAR | Status: DC | PRN
Start: 2016-09-23 — End: 2016-09-26

## 2016-09-23 MED ORDER — OXYTOCIN 10 UNIT/ML IJ SOLN
INTRAVENOUS | Status: DC | PRN
  Administered 2016-09-23: 40 [IU] via INTRAVENOUS

## 2016-09-23 MED ORDER — LACTATED RINGERS IV SOLN
INTRAVENOUS | Status: DC
  Administered 2016-09-23 (×2): via INTRAVENOUS

## 2016-09-23 MED ORDER — PROMETHAZINE HCL 25 MG/ML IJ SOLN
25.0000 mg | Freq: Once | INTRAMUSCULAR | Status: AC
  Administered 2016-09-23: 25 mg via INTRAVENOUS
  Filled 2016-09-23: qty 1

## 2016-09-23 MED ORDER — TETANUS-DIPHTH-ACELL PERTUSSIS 5-2.5-18.5 LF-MCG/0.5 IM SUSP: 0.5000 mL | Freq: Once | INTRAMUSCULAR | Status: DC

## 2016-09-23 MED ORDER — DIBUCAINE 1 % RE OINT: 1.0000 "application " | TOPICAL_OINTMENT | RECTAL | Status: DC | PRN

## 2016-09-23 MED ORDER — NALOXONE HCL 0.4 MG/ML IJ SOLN: 0.4000 mg | INTRAMUSCULAR | Status: DC | PRN

## 2016-09-23 MED ORDER — ZOLPIDEM TARTRATE 5 MG PO TABS: 5.0000 mg | ORAL_TABLET | Freq: Every evening | ORAL | Status: DC | PRN

## 2016-09-23 MED ORDER — NALBUPHINE HCL 10 MG/ML IJ SOLN
5.0000 mg | Freq: Once | INTRAMUSCULAR | Status: DC | PRN
  Filled 2016-09-23: qty 0.5

## 2016-09-23 MED ORDER — PHENYLEPHRINE 40 MCG/ML (10ML) SYRINGE FOR IV PUSH (FOR BLOOD PRESSURE SUPPORT)
PREFILLED_SYRINGE | INTRAVENOUS | Status: AC
  Filled 2016-09-23: qty 10

## 2016-09-23 MED ORDER — DIPHENHYDRAMINE HCL 25 MG PO CAPS: 25.0000 mg | ORAL_CAPSULE | Freq: Four times a day (QID) | ORAL | Status: DC | PRN

## 2016-09-23 MED ORDER — METOCLOPRAMIDE HCL 5 MG/ML IJ SOLN
INTRAMUSCULAR | Status: DC | PRN
  Administered 2016-09-23 (×2): 5 mg via INTRAVENOUS

## 2016-09-23 MED ORDER — SCOPOLAMINE 1 MG/3DAYS TD PT72
1.0000 | MEDICATED_PATCH | Freq: Once | TRANSDERMAL | Status: DC
  Filled 2016-09-23: qty 1

## 2016-09-23 MED ORDER — NALBUPHINE HCL 10 MG/ML IJ SOLN
5.0000 mg | INTRAMUSCULAR | Status: DC | PRN
  Filled 2016-09-23: qty 0.5

## 2016-09-23 MED ORDER — MEPERIDINE HCL 25 MG/ML IJ SOLN
INTRAMUSCULAR | Status: AC
  Filled 2016-09-23: qty 1

## 2016-09-23 MED ORDER — COCONUT OIL OIL: 1.0000 "application " | TOPICAL_OIL | Status: DC | PRN

## 2016-09-23 MED ORDER — PHENYLEPHRINE 8 MG IN D5W 100 ML (0.08MG/ML) PREMIX OPTIME
INJECTION | INTRAVENOUS | Status: DC | PRN
  Administered 2016-09-23: 60 ug/min via INTRAVENOUS

## 2016-09-23 MED ORDER — DEXTROSE 5 % IV SOLN
1.0000 ug/kg/h | INTRAVENOUS | Status: DC | PRN
  Filled 2016-09-23: qty 2

## 2016-09-23 MED ORDER — SIMETHICONE 80 MG PO CHEW: 80.0000 mg | CHEWABLE_TABLET | ORAL | Status: DC | PRN

## 2016-09-23 MED ORDER — WITCH HAZEL-GLYCERIN EX PADS: 1.0000 "application " | MEDICATED_PAD | CUTANEOUS | Status: DC | PRN

## 2016-09-23 MED ORDER — MORPHINE SULFATE (PF) 0.5 MG/ML IJ SOLN
INTRAMUSCULAR | Status: DC | PRN
  Administered 2016-09-23: .2 mg via INTRATHECAL

## 2016-09-23 MED ORDER — FENTANYL CITRATE (PF) 100 MCG/2ML IJ SOLN
INTRAMUSCULAR | Status: AC
  Filled 2016-09-23: qty 2

## 2016-09-23 MED ORDER — METOCLOPRAMIDE HCL 5 MG/ML IJ SOLN
INTRAMUSCULAR | Status: AC
  Filled 2016-09-23: qty 2

## 2016-09-23 MED ORDER — LACTATED RINGERS IV SOLN
INTRAVENOUS | Status: DC
  Administered 2016-09-23 – 2016-09-24 (×2): via INTRAVENOUS

## 2016-09-23 MED ORDER — OXYTOCIN 40 UNITS IN LACTATED RINGERS INFUSION - SIMPLE MED: 2.5000 [IU]/h | INTRAVENOUS | Status: AC

## 2016-09-23 MED ORDER — ACETAMINOPHEN 325 MG PO TABS
ORAL_TABLET | ORAL | Status: AC
  Administered 2016-09-23: 650 mg
  Filled 2016-09-23: qty 2

## 2016-09-23 MED ORDER — PHENYLEPHRINE 8 MG IN D5W 100 ML (0.08MG/ML) PREMIX OPTIME
INJECTION | INTRAVENOUS | Status: AC
Start: 2016-09-23 — End: 2016-09-23
  Filled 2016-09-23: qty 100

## 2016-09-23 MED ORDER — OXYCODONE HCL 5 MG PO TABS
10.0000 mg | ORAL_TABLET | ORAL | Status: DC | PRN
  Administered 2016-09-25: 10 mg via ORAL
  Filled 2016-09-23 (×2): qty 2

## 2016-09-23 MED ORDER — SIMETHICONE 80 MG PO CHEW
80.0000 mg | CHEWABLE_TABLET | ORAL | Status: DC
  Administered 2016-09-23 – 2016-09-24 (×2): 80 mg via ORAL
  Filled 2016-09-23 (×2): qty 1

## 2016-09-23 MED ORDER — BUPIVACAINE HCL (PF) 0.25 % IJ SOLN
INTRAMUSCULAR | Status: AC
  Filled 2016-09-23: qty 30

## 2016-09-23 MED ORDER — SODIUM CHLORIDE 0.9 % IR SOLN
Status: DC | PRN
  Administered 2016-09-23: 1000 mL

## 2016-09-23 MED ORDER — FENTANYL CITRATE (PF) 100 MCG/2ML IJ SOLN
INTRAMUSCULAR | Status: DC | PRN
  Administered 2016-09-23: 10 ug via INTRATHECAL

## 2016-09-23 MED ORDER — PROMETHAZINE HCL 25 MG/ML IJ SOLN: 6.2500 mg | INTRAMUSCULAR | Status: DC | PRN

## 2016-09-23 MED ORDER — MORPHINE SULFATE (PF) 0.5 MG/ML IJ SOLN
INTRAMUSCULAR | Status: AC
  Filled 2016-09-23: qty 10

## 2016-09-23 MED ORDER — BUPIVACAINE HCL (PF) 0.25 % IJ SOLN
INTRAMUSCULAR | Status: DC | PRN
  Administered 2016-09-23: 30 mL

## 2016-09-23 MED ORDER — LACTATED RINGERS IV SOLN
INTRAVENOUS | Status: DC | PRN
  Administered 2016-09-23: 12:00:00 via INTRAVENOUS

## 2016-09-23 MED ORDER — DIPHENHYDRAMINE HCL 25 MG PO CAPS: 25.0000 mg | ORAL_CAPSULE | ORAL | Status: DC | PRN

## 2016-09-23 MED ORDER — SENNOSIDES-DOCUSATE SODIUM 8.6-50 MG PO TABS
2.0000 | ORAL_TABLET | ORAL | Status: DC
  Administered 2016-09-23 – 2016-09-24 (×2): 2 via ORAL
  Filled 2016-09-23 (×2): qty 2

## 2016-09-23 MED ORDER — PRENATAL MULTIVITAMIN CH
1.0000 | ORAL_TABLET | Freq: Every day | ORAL | Status: DC
  Administered 2016-09-24: 1 via ORAL
  Filled 2016-09-23: qty 1

## 2016-09-23 MED ORDER — BUPIVACAINE IN DEXTROSE 0.75-8.25 % IT SOLN
INTRATHECAL | Status: AC
  Filled 2016-09-23: qty 2

## 2016-09-23 MED ORDER — MEPERIDINE HCL 25 MG/ML IJ SOLN: 6.2500 mg | INTRAMUSCULAR | Status: DC | PRN

## 2016-09-23 MED ORDER — ONDANSETRON HCL 4 MG/2ML IJ SOLN
INTRAMUSCULAR | Status: DC | PRN
  Administered 2016-09-23: 4 mg via INTRAVENOUS

## 2016-09-23 MED ORDER — ACETAMINOPHEN 325 MG PO TABS
650.0000 mg | ORAL_TABLET | ORAL | Status: DC | PRN
  Administered 2016-09-23 – 2016-09-25 (×8): 650 mg via ORAL
  Filled 2016-09-23 (×8): qty 2

## 2016-09-23 MED ORDER — ONDANSETRON HCL 4 MG/2ML IJ SOLN
INTRAMUSCULAR | Status: AC
  Filled 2016-09-23: qty 2

## 2016-09-23 MED ORDER — SOD CITRATE-CITRIC ACID 500-334 MG/5ML PO SOLN
ORAL | Status: AC
  Filled 2016-09-23: qty 15

## 2016-09-23 MED ORDER — SODIUM CHLORIDE 0.9% FLUSH: 3.0000 mL | INTRAVENOUS | Status: DC | PRN

## 2016-09-23 MED ORDER — LACTATED RINGERS IV SOLN: INTRAVENOUS | Status: DC

## 2016-09-23 SURGICAL SUPPLY — 42 items
BENZOIN TINCTURE PRP APPL 2/3 (GAUZE/BANDAGES/DRESSINGS) ×3 IMPLANT
CHLORAPREP W/TINT 26ML (MISCELLANEOUS) ×3 IMPLANT
CLAMP CORD UMBIL (MISCELLANEOUS) IMPLANT
CLOTH BEACON ORANGE TIMEOUT ST (SAFETY) ×3 IMPLANT
DRSG OPSITE POSTOP 4X10 (GAUZE/BANDAGES/DRESSINGS) ×3 IMPLANT
ELECT REM PT RETURN 9FT ADLT (ELECTROSURGICAL) ×3
ELECTRODE REM PT RTRN 9FT ADLT (ELECTROSURGICAL) ×2 IMPLANT
EXTRACTOR VACUUM BELL STYLE (SUCTIONS) IMPLANT
EXTRACTOR VACUUM KIWI (MISCELLANEOUS) ×3 IMPLANT
GLOVE BIO SURGEON STRL SZ 6.5 (GLOVE) ×3 IMPLANT
GLOVE BIOGEL PI IND STRL 7.0 (GLOVE) ×4 IMPLANT
GLOVE BIOGEL PI INDICATOR 7.0 (GLOVE) ×2
GOWN STRL REUS W/TWL LRG LVL3 (GOWN DISPOSABLE) ×9 IMPLANT
HOVERMATT SINGLE USE (MISCELLANEOUS) ×3 IMPLANT
KIT ABG SYR 3ML LUER SLIP (SYRINGE) IMPLANT
NEEDLE HYPO 22GX1.5 SAFETY (NEEDLE) IMPLANT
NEEDLE HYPO 25X5/8 SAFETYGLIDE (NEEDLE) IMPLANT
NEEDLE SPNL 22GX7 QUINCKE BK (NEEDLE) ×3 IMPLANT
NS IRRIG 1000ML POUR BTL (IV SOLUTION) ×3 IMPLANT
PACK C SECTION WH (CUSTOM PROCEDURE TRAY) ×3 IMPLANT
PAD ABD 7.5X8 STRL (GAUZE/BANDAGES/DRESSINGS) ×3 IMPLANT
PAD OB MATERNITY 4.3X12.25 (PERSONAL CARE ITEMS) ×3 IMPLANT
PENCIL BUTTON HOLSTER BLD 10FT (ELECTRODE) ×3 IMPLANT
PENCIL SMOKE EVAC W/HOLSTER (ELECTROSURGICAL) ×3 IMPLANT
RTRCTR C-SECT PINK 25CM LRG (MISCELLANEOUS) ×3 IMPLANT
SPONGE GAUZE 4X4 12PLY STER LF (GAUZE/BANDAGES/DRESSINGS) ×6 IMPLANT
SPONGE LAP 18X18 X RAY DECT (DISPOSABLE) ×3 IMPLANT
STRIP CLOSURE SKIN 1/2X4 (GAUZE/BANDAGES/DRESSINGS) ×3 IMPLANT
SUT CHROMIC 2 0 CT 1 (SUTURE) ×6 IMPLANT
SUT MNCRL 0 VIOLET CTX 36 (SUTURE) ×4 IMPLANT
SUT MONOCRYL 0 CTX 36 (SUTURE) ×2
SUT PDS AB 0 CTX 36 PDP370T (SUTURE) IMPLANT
SUT PLAIN 2 0 (SUTURE)
SUT PLAIN 2 0 XLH (SUTURE) ×6 IMPLANT
SUT PLAIN ABS 2-0 CT1 27XMFL (SUTURE) IMPLANT
SUT VIC AB 0 CTX 36 (SUTURE) ×2
SUT VIC AB 0 CTX36XBRD ANBCTRL (SUTURE) ×4 IMPLANT
SUT VIC AB 4-0 KS 27 (SUTURE) ×3 IMPLANT
SYR CONTROL 10ML LL (SYRINGE) ×3 IMPLANT
TAPE MEDIFIX FOAM 3 (GAUZE/BANDAGES/DRESSINGS) ×3 IMPLANT
TOWEL OR 17X24 6PK STRL BLUE (TOWEL DISPOSABLE) ×3 IMPLANT
TRAY FOLEY BAG SILVER LF 14FR (SET/KITS/TRAYS/PACK) ×3 IMPLANT

## 2016-09-23 NOTE — Lactation Note (Signed)
This note was copied from a baby's chart. Lactation Consultation Note  Patient Name: Carla [name] Date: 09/23/2016 Reason for consult: Initial assessment;Late preterm infant   Initial consult with mom of 36w 1d GA in PACU. Mom reports her 36 yo was a 25 week infant and did not latch, mom did pump for her.   Infant being held by FOB. Mom having difficulty with nausea and vomiting. Placed infant STS with mom. Attempted to latch him, he was not interested or cueing to feed. Infant was grunting consistently without increase work of breathing. O2 sats 97-100. Infant left STS with mom while LC hand expressed colostrum. Mom with large compressible breasts and areola. 1 gtt obtained from left breast and fed to infant on finger. 1 ml obtained from right breast and was spoon fed to infant. Infant suckled very briefly on gloved finger. He was again offered the breast and was not interested in latching. He was noted to have high palate when sucking on gloved finger. .   LPT infant policy given and explained to parents. Enc mom to limit stimulation to infant, keep him STS as much as possible with mom and dad, keep his hat on and feed him 8-12 x in 24 hours at first feeding cues with no longer than 3 hours between feeds. Reviewed supplementation amounts based on day of age if not eating well by 8 hours of age or in the case of hypoglycemia. Mom is ok with infant receiving formula if needed. Feeding log given with instructions for use.   Discussed with mom that as a LPT infant, he may or may not be a good BF and that we need to supplement if EBM not available and to protect mom's milk supply. Mom voiced understanding. Discussed with mom beginning to pump to stimulate milk production once she is feeling a little better and can sit up. Skipper Cliche, RN aware mom needs pump set up.   Infant was placed STS with FOB at end of consult as infant temperature was dropping and mom was cold to touch. Warm  blankets were applied to mom and infant. Parents without further questions/concerns at this time.   BF Resources Handout and [place] Brochure given, mom informed of IP/OP Services, BF Support Groups and [place] phone #. Enc mom to call out for feeding assistance as needed.    Maternal Data Formula Feeding for Exclusion: Yes Reason for exclusion: Mother's choice to formula and breast feed on admission Has patient been taught Hand Expression?: No (LC performed, mom feeling ill) Does the patient have breastfeeding experience prior to this delivery?: Yes  Feeding Feeding Type: Breast Fed  LATCH Score/Interventions Latch: Too sleepy or reluctant, no latch achieved, no sucking elicited. Intervention(s): Teach feeding cues;Skin to skin;Waking techniques  Audible Swallowing: None Intervention(s): Hand expression;Skin to skin  Type of Nipple: Everted at rest and after stimulation  Comfort (Breast/Nipple): Soft / non-tender     Hold (Positioning): Full assist, staff holds infant at breast Intervention(s): Breastfeeding basics reviewed;Support Pillows;Position options;Skin to skin  LATCH Score: 4  Lactation Tools Discussed/Used WIC Program: No   Consult Status Consult Status: Follow-up Date: 09/24/16 Follow-up type: In-patient    Carla [name] 09/23/2016, 2:07 PM

## 2016-09-23 NOTE — Anesthesia Postprocedure Evaluation (Signed)
Anesthesia Post Note  Patient: Carla [name]  Procedure(s) Performed: Procedure(s) (LRB): CESAREAN SECTION (N/A) removal CERCLAGE CERVICAL (N/A)     Patient location during evaluation: PACU Anesthesia Type: Spinal Level of consciousness: awake and alert Pain management: pain level controlled Vital Signs Assessment: post-procedure vital signs reviewed and stable Respiratory status: spontaneous breathing and respiratory function stable Cardiovascular status: blood pressure returned to baseline and stable Postop Assessment: spinal receding Anesthetic complications: no    Last Vitals:  Vitals:   09/23/16 1345 09/23/16 1400  BP: 127/71 127/73  Pulse: 93 91  Resp: 20 19  Temp:      Last Pain:  Vitals:   09/23/16 1400  TempSrc:   PainSc: 0-No pain   Pain Goal:                 Nehal Shives DANIEL

## 2016-09-23 NOTE — Transfer of Care (Signed)
Immediate Anesthesia Transfer of Care Note  Patient: Carla [name]  Procedure(s) Performed: Procedure(s) with comments: CESAREAN SECTION (N/A) - RNFA  Tracey T removal CERCLAGE CERVICAL (N/A)  Patient Location: PACU  Anesthesia Type:Spinal  Level of Consciousness: awake, alert , oriented and patient cooperative  Airway & Oxygen Therapy: Patient Spontanous Breathing  Post-op Assessment: Report given to RN and Post -op Vital signs reviewed and stable  Post vital signs: Reviewed and stable  Last Vitals:  Vitals:   09/23/16 0950  BP: 114/88  Pulse: (!) 130  Resp: 18  Temp: 37 C    Last Pain:  Vitals:   09/23/16 0950  TempSrc: Oral         Complications: No apparent anesthesia complications

## 2016-09-23 NOTE — Anesthesia Procedure Notes (Signed)
Spinal  Patient location during procedure: OR Start time: 09/23/2016 11:43 AM End time: 09/23/2016 11:55 AM Staffing Anesthesiologist: Duane Boston Performed: anesthesiologist  Preanesthetic Checklist Completed: patient identified, surgical consent, pre-op evaluation, timeout performed, IV checked, risks and benefits discussed and monitors and equipment checked Spinal Block Patient position: sitting Prep: DuraPrep Patient monitoring: cardiac monitor, continuous pulse ox and blood pressure Approach: midline Location: L2-3 Injection technique: single-shot Needle Needle type: Pencan  Needle gauge: 24 G Needle length: 9 cm Additional Notes Functioning IV was confirmed and monitors were applied. Sterile prep and drape, including hand hygiene and sterile gloves were used. The patient was positioned and the spine was prepped. The skin was anesthetized with lidocaine.  Free flow of clear CSF was obtained prior to injecting local anesthetic into the CSF.  The spinal needle aspirated freely following injection.  The needle was carefully withdrawn.  The patient tolerated the procedure well.

## 2016-09-23 NOTE — Progress Notes (Signed)
Dr Alwyn Pea notified patient is nauseated and vomiting, see new order.

## 2016-09-23 NOTE — H&P (Signed)
Carla [name] is a 36 y.o. female [phone] at 20 weeks 1 day presenting for primary c-section As recommended by MFM for AMA, Hypertension, and h/o Myomectomy (endometrium entered) Patient denies ctx, no LOF, no vaginal bleeding.  No s/sx of superimposed preeclampsia. She has a h/o cervical insufficiency - cerclage in place.   OB History    Gravida Para Term Preterm AB Living   5 1 0 1 3 0   SAB TAB Ectopic Multiple Live Births   2   1   0     Past Medical History:  Diagnosis Date  . Allergy   . Asthma   . Fibroids   . Pregnancy induced hypertension    Past Surgical History:  Procedure Laterality Date  . CERVICAL CERCLAGE  2003, 2008  . CERVICAL CERCLAGE N/A 04/12/2016   Procedure: CERCLAGE CERVICAL;  Surgeon: Bobbye Charleston, MD;  Location: [facility];  Service: Gynecology;  Laterality: N/A;  . DILATION AND CURETTAGE OF UTERUS    . ectopic    . MYOMECTOMY  2016   fibroid removal   Family History: family history includes Alzheimer's disease in her maternal grandmother; Cancer in her maternal grandfather, maternal uncle, paternal grandfather, and paternal uncle; Diabetes in her maternal uncle; Heart disease in her maternal aunt and paternal uncle; Hypertension in her mother; Kidney disease in her paternal grandmother; Learning disabilities in her paternal uncle. Social History:  reports that she has never smoked. She has never used smokeless tobacco. She reports that she does not drink alcohol or use drugs.     Maternal Diabetes: No Genetic Screening: Normal Maternal Ultrasounds/Referrals: Normal Fetal Ultrasounds or other Referrals:  Referred to Materal Fetal Medicine  Maternal Substance Abuse:  No Significant Maternal Medications:  Meds include: Other: prenatal vitamins / proAir Significant Maternal Lab Results:  Lab values include: Group B Strep negative Other Comments:  None  Review of Systems  Constitutional: Negative.   Eyes: Negative.  Negative for blurred vision and  double vision.  All other systems reviewed and are negative.  Maternal Medical History:  Contractions: Onset was less than 1 hour ago.   Frequency: irregular.   Perceived severity is mild.    Fetal activity: Perceived fetal activity is normal.   Last perceived fetal movement was within the past 12 hours.    Prenatal complications: PIH.   Prenatal Complications - Diabetes: none.      Blood pressure 114/88, pulse (!) 130, temperature 98.6 F (37 C), temperature source Oral, resp. rate 18, height 5\' 7"  (1.702 m), weight (!) 154.2 kg (340 lb), last menstrual period 01/14/2016. Maternal Exam:  Uterine Assessment: Contraction frequency is irregular.   Abdomen: Patient reports no abdominal tenderness. Fundal height is 37cm.   Estimated fetal weight is 2900 grams.   Fetal presentation: vertex  Introitus: Normal vulva. Normal vagina.  Ferning test: not done.      Fetal Exam Fetal Monitor Review: Baseline rate: 140.  Variability: moderate (6-25 bpm).   Pattern: no decelerations and accelerations present.    Fetal State Assessment: Category I - tracings are normal.     Physical Exam  Prenatal labs: ABO, Rh: --/--/AB POS, AB POS (06/28 1145) Antibody: NEG (06/28 1145) Rubella: Immune (01/08 0000) RPR: Non Reactive (06/28 1145)  HBsAg: Negative (01/08 0000)  HIV: Non-reactive (01/08 0000)  GBS:   NEG  Assessment/Plan: 36 yo [id number] at 36 weeks 1 day for primary c-section H/o Myomectomy, gestational hypertension, uterine fibroids.  On call to OR for c-section and  cerclage removal Clindamycin and Gent for surgical prophylaxis   Carla [name], [place] 09/23/2016, 10:17 AM

## 2016-09-23 NOTE — Op Note (Signed)
Cesarean Section Procedure Note  Indications: [id number] at 36 weeks 1 day with h/o myomectomy that entered the endometrium, moderate to severe range hypertension, AMA.    Pre-operative Diagnosis: 36 week 1 day pregnancy, h/o gestational HTN, AMA, prior myomectomy  Post-operative Diagnosis: same  Surgeon: Caffie Damme M.D.  Assistants: Bobbye Charleston M.D.  Anesthesia: Spinal anesthesia  ASA Class: 2  Procedure Details   The patient was counseled about the risks, benefits, complications of the cesarean section. The patient concurred with the proposed plan, giving informed consent.  The site of surgery properly noted/marked. The patient was taken to Operating Room # 9, identified as Carla [name] and the procedure verified as C-Section Delivery. A Time Out was held and the above information confirmed.  After spinal was found to adequate , the patient was placed in the dorsal supine position with a leftward tilt, draped and prepped in the usual sterile manner. A Pfannenstiel incision was made and carried down through the subcutaneous tissue to the fascia.  The fascia was incised in the midline and the fascial incision was extended laterally with Mayo scissors. The superior aspect of the fascial incision was grasped with Coker clamps x2, tented up and the rectus muscles dissected off sharply with the scalpel. The rectus was then dissected off with blunt dissection and Mayo scissors inferiorly. The rectus muscles were separated in the midline. The abdominal peritoneum was identified, tented up, entered sharply, and the incision was extended superiorly and inferiorly with good visualization of the bladder. The Alexis retractor was then deployed. The vesicouterine peritoneum was identified, tented up, entered sharply with Metzenbaum scissors, and the bladder flap was created digitally. Scalpel was then used to make a low transverse incision on the uterus which was extended laterally with  blunt  dissection. The fetal vertex was identified, the head was brought out from the pelvis. A Kiwi Vacuum was placed to aid in delivery of the vertex.  The head was then delivered easily through the uterine incision followed by the body. The A live female infant was bulb suctioned on the operative field cried vigorously, cord was clamped and cut and the infant was passed to the waiting neonatologist. Apgars 9/9. Placenta was then delivered spontaneously, intact and appear normal, the uterus was cleared of all clot and debris. The uterine incision was repaired with #0 Monocryl in running locked fashion. A second imbricating suture was performed using the same suture. There was a sinus that was persistently bleeding, this was alleviated by figure of eight suture of 0-monocryl. The incision was now hemostatic. Ovaries and tubes were inspected and normal. Small subserosal fibroid seen.  The Alexis retractor was removed. The abdominal cavity was cleared of all clot and debris. The abdominal peritoneum was reapproximated with 2-0 chromic  in a running fashion, the rectus muscles was reapproximated with #2 chromic in interrupted fashion. The fascia was closed with 0 Vicryl in a running fashion coming from both ends and meeting in the middle. The subcuticular layer was irrigated and all bleeders cauterized.  Subcutaneous layer was re-approximated with 2-0 plain gut. The incision was injected with 20 cc 0.25% Marcaine.   The skin was closed with 4-0 vicryl in a subcuticular fashion using a Lanny Hurst needle. The incision was dressed with benzoine, steri strips and pressure dressing. All sponge lap and needle counts were correct x3.  The patient was placed in dorsal lithotomy position and the cerclage was cut out w/o difficulty.   Patient tolerated the procedure well and  recovered in stable condition following the procedure.   Findings: Live female infant, Apgars 9/9, clear amniotic fluid, normal appearing placenta, fibroid uterus,  bilateral tubes and ovaries  Estimated Blood Loss: 1225mL         Drains: Foley catheter 150 cc clear urine         Specimens: Placenta to L&D         Implants: none         Complications:  PPH         Disposition: PACU - hemodynamically stable.   Carla [name] STACIA

## 2016-09-24 ENCOUNTER — Encounter (HOSPITAL_COMMUNITY): Payer: Self-pay | Admitting: Obstetrics & Gynecology

## 2016-09-24 LAB — CBC
HCT: 28.4 % — ABNORMAL LOW (ref 36.0–46.0)
Hemoglobin: 9.8 g/dL — ABNORMAL LOW (ref 12.0–15.0)
MCH: 31.5 pg (ref 26.0–34.0)
MCHC: 34.5 g/dL (ref 30.0–36.0)
MCV: 91.3 fL (ref 78.0–100.0)
PLATELETS: 208 10*3/uL (ref 150–400)
RBC: 3.11 MIL/uL — ABNORMAL LOW (ref 3.87–5.11)
RDW: 14 % (ref 11.5–15.5)
WBC: 6.8 10*3/uL (ref 4.0–10.5)

## 2016-09-24 LAB — BIRTH TISSUE RECOVERY COLLECTION (PLACENTA DONATION)

## 2016-09-24 NOTE — Progress Notes (Signed)
MOB was referred for history of depression/anxiety.  Referral is screened out by Clinical Social Worker because none of the following criteria appear to apply and there are no reports impacting the pregnancy or her transition to the postpartum period.  CSW does not deem it clinically necessary to further investigate at this time.   -History of anxiety/depression during this pregnancy, or of post-partum depression. - Diagnosis of anxiety and/or depression within last 3 years.-  - History of depression due to pregnancy loss/loss of child or -MOB's symptoms are currently being treated with medication and/or therapy.  CSW completed chart review to include [facility] records. CSW saw no mention of anxiety hx nor any concerns noted. Please contact the Clinical Social Worker if needs arise or upon MOB request.    Oda Cogan, MSW, [facility]  Office: [phone]

## 2016-09-24 NOTE — Lactation Note (Signed)
This note was copied from a baby's chart. Lactation Consultation Note  Patient Name: Carla [name] KYHCW'C Date: 09/24/2016 Reason for consult: Follow-up assessment;Late preterm infant   Follow up with mom of 32 hour old LPT infant. Infant asleep laying in mom's arms. Mom reports infant is not interested in latching. She is pumping and is attempting to increase to every 3 hours. She is not obtaining colostrum with the pump yet. She is hand expressing post pumping and obtaining a few gtts colostrum ,enc her to put colostrum in infant's mouth.  Infant is feeding Alimentum via bottle and tolerating it well. He has had 7 formula feeds of 5-25 cc, 4 voids and 0 stool in last 24 hours. Reviewed supplementation amounts pre LPT infant feeding guidelines. Parents without questions/concerns at this time.    Maternal Data Formula Feeding for Exclusion: Yes Reason for exclusion: Mother's choice to formula and breast feed on admission Has patient been taught Hand Expression?: Yes Does the patient have breastfeeding experience prior to this delivery?: Yes  Feeding Feeding Type: Bottle Fed - Formula Nipple Type: Slow - flow  LATCH Score/Interventions                      Lactation Tools Discussed/Used Pump Review: Setup, frequency, and cleaning Initiated by:: Reviewed and encouraged   Consult Status Consult Status: Follow-up Date: 09/25/16 Follow-up type: In-patient    Debby Freiberg Hice 09/24/2016, 2:26 PM

## 2016-09-24 NOTE — Progress Notes (Signed)
Subjective: Postpartum Day 1: Cesarean Delivery Patient reports tolerating PO, + flatus and no problems voiding.    Objective: Vital signs in last 24 hours: Temp:  [97.7 F (36.5 C)-98.8 F (37.1 C)] 98.2 F (36.8 C) (06/30 0755) Pulse Rate:  [82-130] 101 (06/30 0755) Resp:  [15-21] 18 (06/30 0755) BP: (114-145)/(66-88) 137/68 (06/30 0755) SpO2:  [95 %-100 %] 98 % (06/30 0755) Weight:  [154.2 kg (340 lb)] 154.2 kg (340 lb) (06/29 0950)  Physical Exam:  General: alert, cooperative and no distress Lochia: appropriate Uterine Fundus: firm Incision: healing well, no significant drainage, no significant erythema DVT Evaluation: No evidence of DVT seen on physical exam. Negative Homan's sign. No cords or calf tenderness.   Recent Labs  09/22/16 1145 09/24/16 0502  HGB 12.0 9.8*  HCT 35.5* 28.4*    Assessment/Plan: Status post Cesarean section. Doing well postoperatively.  Continue current care.  Logan Baltimore, [place] 09/24/2016, 8:41 AM

## 2016-09-25 ENCOUNTER — Inpatient Hospital Stay (HOSPITAL_COMMUNITY): Payer: Commercial Managed Care - PPO

## 2016-09-25 MED ORDER — IOPAMIDOL (ISOVUE-300) INJECTION 61%
30.0000 mL | INTRAVENOUS | Status: AC
  Administered 2016-09-25 (×2): 30 mL via ORAL

## 2016-09-25 MED ORDER — PROMETHAZINE HCL 25 MG/ML IJ SOLN: 25.0000 mg | Freq: Four times a day (QID) | INTRAMUSCULAR | Status: DC | PRN

## 2016-09-25 MED ORDER — KETOROLAC TROMETHAMINE 30 MG/ML IJ SOLN
30.0000 mg | Freq: Four times a day (QID) | INTRAMUSCULAR | Status: DC
  Administered 2016-09-25 – 2016-09-26 (×5): 30 mg via INTRAVENOUS
  Filled 2016-09-25 (×5): qty 1

## 2016-09-25 MED ORDER — MORPHINE SULFATE (PF) 4 MG/ML IV SOLN
2.0000 mg | Freq: Once | INTRAVENOUS | Status: AC
  Administered 2016-09-25: 2 mg via INTRAVENOUS
  Filled 2016-09-25: qty 1

## 2016-09-25 MED ORDER — IOPAMIDOL (ISOVUE-300) INJECTION 61%
100.0000 mL | Freq: Once | INTRAVENOUS | Status: AC | PRN
  Administered 2016-09-25: 100 mL via INTRAVENOUS

## 2016-09-25 MED ORDER — PANTOPRAZOLE SODIUM 40 MG IV SOLR
40.0000 mg | Freq: Every day | INTRAVENOUS | Status: DC
  Administered 2016-09-25 – 2016-09-26 (×2): 40 mg via INTRAVENOUS
  Filled 2016-09-25 (×3): qty 40

## 2016-09-25 MED ORDER — BISACODYL 10 MG RE SUPP
10.0000 mg | Freq: Every day | RECTAL | Status: DC | PRN
  Administered 2016-09-25: 10 mg via RECTAL
  Filled 2016-09-25: qty 1

## 2016-09-25 MED ORDER — MORPHINE SULFATE (PF) 4 MG/ML IV SOLN
2.0000 mg | INTRAVENOUS | Status: DC | PRN
  Administered 2016-09-25 (×2): 2 mg via INTRAVENOUS
  Filled 2016-09-25 (×2): qty 1

## 2016-09-25 MED ORDER — KETOROLAC TROMETHAMINE 30 MG/ML IJ SOLN
60.0000 mg | Freq: Once | INTRAMUSCULAR | Status: AC
  Administered 2016-09-25: 60 mg via INTRAVENOUS
  Filled 2016-09-25: qty 2

## 2016-09-25 MED ORDER — ONDANSETRON 4 MG PO TBDP
4.0000 mg | ORAL_TABLET | Freq: Three times a day (TID) | ORAL | Status: DC | PRN
  Administered 2016-09-25: 4 mg via ORAL
  Filled 2016-09-25 (×2): qty 1

## 2016-09-25 MED ORDER — LACTATED RINGERS IV SOLN
INTRAVENOUS | Status: DC
  Administered 2016-09-25 – 2016-09-26 (×3): via INTRAVENOUS

## 2016-09-25 MED ORDER — ACETAMINOPHEN 10 MG/ML IV SOLN
1000.0000 mg | Freq: Four times a day (QID) | INTRAVENOUS | Status: AC | PRN
  Filled 2016-09-25: qty 100

## 2016-09-25 NOTE — Progress Notes (Signed)
Abdominal binder applied per MD order. Pt tolerated well. Will continue to monitor pt.

## 2016-09-25 NOTE — Progress Notes (Signed)
Abdominal binder applied per MD order

## 2016-09-25 NOTE — Lactation Note (Signed)
This note was copied from a baby's chart. Lactation Consultation Note  Patient Name: Carla [name] Date: 09/25/2016  Randel Books is in the nursery.  Mom is not feeling well.  Baby receiving formula bottles.  Mom is too sick to pump at this point.  Follow up when she is feeling better.   Maternal Data    Feeding Feeding Type: Bottle Fed - Formula Nipple Type: Slow - flow  LATCH Score/Interventions                      Lactation Tools Discussed/Used     Consult Status      Ave Filter 09/25/2016, 2:03 PM

## 2016-09-25 NOTE — Progress Notes (Signed)
Patient ID: Carla [name], female   DOB: 1980/11/02, 36 y.o.   MRN: [id number]   S: Examined patient at bedside as she is still having abdominal pain.  She is also still having nausea. She started vomiting while I was  Present in the room.   She also has had several episodes of flatus.   O:Vitals: BPs mild range hypertension Afebrile Gen: Ill appearing, pale Abdomen: Soft, +TTP, moderate distention, +more bowel sounds present                    Incision : C/D/I   RADS  EXAM: CT ABDOMEN AND PELVIS WITH CONTRAST  TECHNIQUE: Multidetector CT imaging of the abdomen and pelvis was performed using the standard protocol following bolus administration of intravenous contrast.  CONTRAST:  17mL ISOVUE-300 IOPAMIDOL (ISOVUE-300) INJECTION 61%  COMPARISON:  None.  FINDINGS: Lower Chest: No acute findings.  Hepatobiliary:  No masses identified. Gallbladder is unremarkable.  Pancreas:  No mass or inflammatory changes.  Spleen: Within normal limits in size and appearance.  Adrenals/Urinary Tract: No masses identified. No evidence of hydronephrosis. Small amount of gas within urinary bladder, likely due to recent bladder catheterization.  Stomach/Bowel: Diffuse distention of small bowel and colon is seen, consistent with adynamic ileus.  Vascular/Lymphatic: No pathologically enlarged lymph nodes. No abdominal aortic aneurysm.  Reproductive: Enlarged postpartum uterus is seen. Small amount of fluid and gas seen within the endometrial cavity, and endometritis cannot be excluded. No adnexal mass identified.  Other: Tiny amount postop free intraperitoneal air noted. Minimal ascites in the right paracolic gutter.  Musculoskeletal:  No suspicious bone lesions identified.  IMPRESSION: Enlarged postpartum uterus. Small amount of fluid and gas within endometrial cavity ; endometritis cannot be excluded by imaging.  Postop ileus.  Tiny amount of ascites and postop free  intraperitoneal air. Minimal gas within urinary bladder, most likely due to recent Catheterization.   ASSESSMENT / PLAN: Post operative day #2 s/p cesarean section with post operative ileus Patient counseled that if she continues to have vomiting will have to  Put in a NG tube Encouraged patient to ambulate a lot more IV fluids restarted today IV toradol on board to help with pain.  Patient counseled to decrease request For IV narcotic as that will also slow her GI system down.  Will add dulcolax suppository to help her have a BM Continue NPO status Will add IV phenergan prn nausea.   Carla [name] STACIA

## 2016-09-25 NOTE — Progress Notes (Signed)
Subjective: Postpartum Day 2: Cesarean Delivery Patient reports nausea, vomiting, incisional pain, + flatus and no problems voiding.   Patient notes her pain is severe and started last night around 11pm.  She characterizes the pain as "contraction" like.  She is ambulatory, but has  Severe abdominal pain with movement.    Objective: Vital signs in last 24 hours: Temp:  [98.1 F (36.7 C)-98.6 F (37 C)] 98.6 F (37 C) (07/01 0804) Pulse Rate:  [99-114] 111 (07/01 0804) Resp:  [18-22] 22 (07/01 0804) BP: (140-164)/(77-94) 147/94 (07/01 0804) SpO2:  [98 %-99 %] 99 % (07/01 0804)  Physical Exam:  General: alert, cooperative, moderate distress and pale Lochia: appropriate Uterine Fundus: firm Abdomen: Soft, TTP with rebound right lower quadrant Minimal to no bowel sounds in all  Four quadrants. Incision: healing well, no significant drainage, no dehiscence, no significant erythema DVT Evaluation: No evidence of DVT seen on physical exam. Negative Homan's sign. No cords or calf tenderness.   Recent Labs  09/22/16 1145 09/24/16 0502  HGB 12.0 9.8*  HCT 35.5* 28.4*    Assessment/Plan: Status post Cesarean section. With severe abdominal pain and nausea,  Concern for post operative ileus.  Will make patient NPO.   As she had a previous myomectomy before and she is post op, will order a  CT scan of the abdomen to ensure no abdominal bleed, or bowel perf.    IV Toradol (as her "allergy" to NSAIDS isn't a true allergy IV Morphine Will re-start IV fluids.   [place], [place] 09/25/2016, 8:44 AM

## 2016-09-26 ENCOUNTER — Encounter (HOSPITAL_COMMUNITY): Payer: Self-pay | Admitting: Obstetrics & Gynecology

## 2016-09-26 NOTE — Progress Notes (Signed)
Pt has eaten grits, toast,and small amount of salad. Pt states no nausea.

## 2016-09-26 NOTE — Progress Notes (Signed)
Pt tolerated half sandwich, half hashbrown and small amount of rice--no nausea reported.

## 2016-09-26 NOTE — Lactation Note (Signed)
This note was copied from a baby's chart. Lactation Consultation Note: Mother has not pump in 24 hours. She plans to start pumping when she gets home. Discussed treatement to prevent engorgement. Reviewed  S/S of Mastitis. Discussed milk coming to volume. Mother denies breast changes. Advised mother to do good breast massage and ice when milk beginning to come in. Mother is aware of available [place] services, BFSG'S, OP dept. Mother has phone number for [facility] office for questions and concerns.   Patient Name: Carla [name] ARWPT'Y Date: 09/26/2016 Reason for consult: Follow-up assessment   Maternal Data    Feeding    LATCH Score/Interventions                      Lactation Tools Discussed/Used     Consult Status Consult Status: Complete    Darla Lesches 09/26/2016, 12:18 PM

## 2016-09-26 NOTE — Addendum Note (Signed)
Addendum  created 09/26/16 1211 by Duane Boston, MD   Anesthesia Event edited, Anesthesia Staff edited

## 2016-09-26 NOTE — Discharge Summary (Signed)
Obstetric Discharge Summary Reason for Admission: cesarean section Prenatal Procedures: ultrasound Intrapartum Procedures: cesarean: low cervical, transverse Postpartum Procedures: none Complications-Operative and Postpartum: none Hemoglobin  Date Value Ref Range Status  09/24/2016 9.8 (L) 12.0 - 15.0 g/dL Final   HCT  Date Value Ref Range Status  09/24/2016 28.4 (L) 36.0 - 46.0 % Final    Physical Exam:  General: alert and cooperative Lochia: appropriate Uterine Fundus: firm Incision: healing well, no significant drainage DVT Evaluation: No evidence of DVT seen on physical exam.  Discharge Diagnoses:     Pre- Term Pregnancy-delivered  Discharge Information: Date: 09/26/2016 Activity: pelvic rest Diet: routine Medications: PNV and Ibuprofen, pt declines narcotic Rx Condition: stable Instructions: refer to practice specific booklet Discharge to: home Follow-up Information    Sanjuana Kava, MD Follow up in 2 week(s).   Specialty:  Obstetrics and Gynecology Contact information: [address] [place] [address] [phone]           Newborn Data: Live born female  Birth Weight: 6 lb 14.6 oz (3135 g) APGAR: 9, 9  Home with mother.  Allyn Kenner 09/26/2016, 5:40 PM

## 2016-10-27 DIAGNOSIS — Z630 Problems in relationship with spouse or partner: Secondary | ICD-10-CM | POA: Diagnosis not present

## 2016-11-10 ENCOUNTER — Ambulatory Visit (INDEPENDENT_AMBULATORY_CARE_PROVIDER_SITE_OTHER): Payer: Commercial Managed Care - PPO | Admitting: Physician Assistant

## 2016-11-10 ENCOUNTER — Encounter: Payer: Self-pay | Admitting: Physician Assistant

## 2016-11-10 VITALS — BP 136/74 | HR 73 | Temp 98.0°F | Wt 320.6 lb

## 2016-11-10 DIAGNOSIS — L509 Urticaria, unspecified: Secondary | ICD-10-CM

## 2016-11-10 MED ORDER — PREDNISONE 20 MG PO TABS: ORAL_TABLET | ORAL | 0 refills | Status: DC

## 2016-11-10 NOTE — Progress Notes (Signed)
Patient ID: Keryl Gholson MRN: [id number], DOB: 1980/05/17, 36 y.o. Date of Encounter: 11/10/2016, 4:54 PM    Chief Complaint:  Chief Complaint  Patient presents with  . bug bite on leg    right leg     HPI: 36 y.o. year old female presents with above.   She reports that she noticed itching on the back of her right lower leg on Tuesday night (which was 11/08/16). Says since then she has started to develop these areas that are visible which are some areas of  urticaria there on the posterior aspect of the right lower leg.  She says that she came on in to have this evaluated and treated before it got worse. She says that in the past she had similar areas on her arm that ended up requiring multiple treatments of steroids. She shows me pictures on her phone of the areas on her arm from June 2017 and says that those occurred the same way from a bug bite and developed urticaria.  Says that these current areas on her leg are doing the exact same thing as the areas on her arm did in the past. Therefore wanted to go ahead and come get this treated before it gets worse. Also she is breast-feeding so wanted to make sure of what is safe to use. Therefore has used no treatment at home prior to coming in today.     Home Meds:   Outpatient Medications Prior to Visit  Medication Sig Dispense Refill  . albuterol (PROVENTIL HFA;VENTOLIN HFA) 108 (90 Base) MCG/ACT inhaler Inhale 1-2 puffs into the lungs as needed for wheezing or shortness of breath (before exercise). (Patient taking differently: Inhale 2 puffs into the lungs every 4 (four) hours as needed for wheezing or shortness of breath (before exercise). ) 18 g 3  . Prenatal Vit-Fe Fumarate-FA (PRENATAL MULTIVITAMIN) TABS tablet Take 1 tablet by mouth daily at 12 noon.     No facility-administered medications prior to visit.     Allergies:  Allergies  Allergen Reactions  . Penicillins Hives and Nausea And Vomiting    Has patient had a PCN  reaction causing immediate rash, facial/tongue/throat swelling, SOB or lightheadedness with hypotension: No Has patient had a PCN reaction causing severe rash involving mucus membranes or skin necrosis: Yes Has patient had a PCN reaction that required hospitalization No Has patient had a PCN reaction occurring within the last 10 years: No If all of the above answers are "NO", then may proceed with Cephalosporin use.   . Ibuprofen Palpitations  . Latex Rash      Review of Systems: See HPI for pertinent ROS. All other ROS negative.    Physical Exam: Blood pressure 136/74, pulse 73, temperature 98 F (36.7 C), temperature source Oral, weight (!) 320 lb 9.6 oz (145.4 kg), SpO2 98 %, unknown if currently breastfeeding., Body mass index is 50.21 kg/m. General:  Obese AAF. Appears in no acute distress. Neck: Supple. No thyromegaly. No lymphadenopathy. Lungs: Clear bilaterally to auscultation without wheezes, rales, or rhonchi. Breathing is unlabored. Heart: Regular rhythm. No murmurs, rubs, or gallops. Msk:  Strength and tone normal for age. Extremities/Skin: Posterior aspect of right lower leg: There are 2 areas of your urticaria, one above the other. Each of these areas has small raised area in the center consistent with some type of insect bite with surrounding urticaria measuring approximately 1/2 inch diameter. Neuro: Alert and oriented X 3. Moves all extremities spontaneously. Gait is normal.  CNII-XII grossly in tact. Psych:  Responds to questions appropriately with a normal affect.     ASSESSMENT AND PLAN:  36 y.o. year old female with  1. Urticaria Reviewed medication safety with lactation. Benadryl is considered unsafe. Prednisone is actually safe with breast-feeding. She reports that in the past prednisone did not cause her to feel jittery or have insomnia. Reviewed dosing/tapering. Take as directed. Follow-up if your urticaria worsens or does not resolve with this treatment. -  predniSONE (DELTASONE) 20 MG tablet; Take 3 daily for 2 days, then 2 daily for 2 days, then 1 daily for 2 days.  Dispense: 12 tablet; Refill: 0   Signed, [address] [place], [place], [facility] 11/10/2016 4:54 PM

## 2017-01-30 ENCOUNTER — Encounter: Payer: Self-pay | Admitting: Family Medicine

## 2017-02-15 ENCOUNTER — Other Ambulatory Visit: Payer: Self-pay | Admitting: Family Medicine

## 2017-02-15 MED ORDER — ALBUTEROL SULFATE HFA 108 (90 BASE) MCG/ACT IN AERS: 2.0000 | INHALATION_SPRAY | RESPIRATORY_TRACT | 1 refills | Status: DC | PRN

## 2017-03-01 ENCOUNTER — Encounter: Payer: Self-pay | Admitting: Family Medicine

## 2017-03-22 DIAGNOSIS — M654 Radial styloid tenosynovitis [de Quervain]: Secondary | ICD-10-CM | POA: Diagnosis not present

## 2017-04-12 ENCOUNTER — Encounter: Payer: Self-pay | Admitting: Family Medicine

## 2017-04-25 ENCOUNTER — Other Ambulatory Visit: Payer: Self-pay | Admitting: Family Medicine

## 2017-04-25 MED ORDER — OSELTAMIVIR PHOSPHATE 75 MG PO CAPS: 75.0000 mg | ORAL_CAPSULE | Freq: Every day | ORAL | 0 refills | Status: DC

## 2017-04-25 NOTE — Progress Notes (Signed)
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 No allergies, he was here at visit today with son. No current doctor, does not have chart in [place]  Will go ahead and write script for him Tamiflu 75mg  once a day for 7 days

## 2017-07-03 DIAGNOSIS — H02814 Retained foreign body in left upper eyelid: Secondary | ICD-10-CM | POA: Diagnosis not present

## 2017-11-30 DIAGNOSIS — J309 Allergic rhinitis, unspecified: Secondary | ICD-10-CM | POA: Diagnosis not present

## 2018-01-05 DIAGNOSIS — L508 Other urticaria: Secondary | ICD-10-CM | POA: Diagnosis not present

## 2018-12-21 ENCOUNTER — Ambulatory Visit: Payer: Commercial Managed Care - PPO | Admitting: Family Medicine

## 2018-12-25 ENCOUNTER — Other Ambulatory Visit: Payer: Self-pay

## 2018-12-25 ENCOUNTER — Ambulatory Visit (INDEPENDENT_AMBULATORY_CARE_PROVIDER_SITE_OTHER): Payer: 59 | Admitting: Family Medicine

## 2018-12-25 ENCOUNTER — Encounter: Payer: Self-pay | Admitting: Family Medicine

## 2018-12-25 DIAGNOSIS — E785 Hyperlipidemia, unspecified: Secondary | ICD-10-CM

## 2018-12-25 DIAGNOSIS — N926 Irregular menstruation, unspecified: Secondary | ICD-10-CM

## 2018-12-25 DIAGNOSIS — E7439 Other disorders of intestinal carbohydrate absorption: Secondary | ICD-10-CM

## 2018-12-25 DIAGNOSIS — J4520 Mild intermittent asthma, uncomplicated: Secondary | ICD-10-CM

## 2018-12-25 MED ORDER — ALBUTEROL SULFATE HFA 108 (90 BASE) MCG/ACT IN AERS: 2.0000 | INHALATION_SPRAY | RESPIRATORY_TRACT | 2 refills | Status: DC | PRN

## 2018-12-25 MED ORDER — CETIRIZINE HCL 10 MG PO TABS: 10.0000 mg | ORAL_TABLET | Freq: Every day | ORAL | 11 refills | Status: AC | PRN

## 2018-12-25 NOTE — Assessment & Plan Note (Signed)
Continue albuterol prn and allergy meds Declines flu shot

## 2018-12-25 NOTE — Assessment & Plan Note (Signed)
Discussed ways to incorporate better nutrition, meal prepping on weekend Crockpot, healthier to go snacks Weight and recent lipids, glucose concerning Will check A1C and lipid panel in office I dont have the preliminary labs from her UC visit , but they advised her to come here for recheck and management

## 2018-12-25 NOTE — Patient Instructions (Addendum)
We will call with lab results  F/U 4 MONTHS for Physical

## 2018-12-25 NOTE — Progress Notes (Signed)
   Subjective:    Patient ID: Carla [name], female    DOB: 1980-04-29, 38 y.o.   MRN: [id number]  Patient presents for Follow-up (is fasting)   Pt here for F/U last seen in 2018, she is now re-establishing care.   GYN- no longer following ,due for PAP Smear   She did have CPE with her job- had at Urgent Care told her cholesterol was high, her blood sugar was  114, had had cake pop and Latte Also told blood pressure was up a little, this did come down with her just sitting there She does have history of gestational HTN  She is taking multivtamins, hair and nail vitamins   She has seen dentist-told peridontal diseasae Seen eye doctor a year old, treated for possible conjunctivitis   Asthma- uses albuterol maybe 1-2x a month  she is using   Missed menses this month, has history of irregular menses   She is works 2 jobs, Music therapist, also Investment banker, corporate, and has toddler and [name]   Morbid obesity- very busy schedule but sits most of day, and often grabbing snacks instead of full meals  Review Of Systems:  GEN- denies fatigue, fever, weight loss,weakness, recent illness HEENT- denies eye drainage, change in vision, nasal discharge, CVS- denies chest pain, palpitations RESP- denies SOB, cough, wheeze ABD- denies N/V, change in stools, abd pain GU- denies dysuria, hematuria, dribbling, incontinence MSK- denies joint pain, muscle aches, injury Neuro- denies headache, dizziness, syncope, seizure activity       Objective:    BP 136/88   Pulse 84   Temp 98.7 F (37.1 C) (Oral)   Resp 14   Ht 5\' 8"  (1.727 m)   Wt (!) 342 lb (155.1 kg)   SpO2 98%   BMI 52.00 kg/m  GEN- NAD, alert and oriented x3,obese  HEENT- PERRL, EOMI, non injected sclera, pink conjunctiva, MMM, oropharynx clear Neck- Supple, no thyromegaly CVS- RRR, no murmur RESP-CTAB ABD-NABS,soft,NT,ND EXT- No edema Pulses- Radial, DP- 2+   Reviewed AUDITC/FALL/DEPRESSION SCreen negative       Assessment & Plan:      Problem List Items Addressed This Visit      Unprioritized   Asthma    Continue albuterol prn and allergy meds Declines flu shot       Relevant Medications   albuterol (VENTOLIN HFA) 108 (90 Base) MCG/ACT inhaler   Hyperlipidemia   Relevant Orders   Lipid panel   Morbid obesity ([place]) - Primary    Discussed ways to incorporate better nutrition, meal prepping on weekend Crockpot, healthier to go snacks Weight and recent lipids, glucose concerning Will check A1C and lipid panel in office I dont have the preliminary labs from her UC visit , but they advised her to come here for recheck and management       Relevant Orders   Comprehensive metabolic panel   CBC with Differential/Platelet    Other Visit Diagnoses    Glucose intolerance       Relevant Orders   Hemoglobin A1c   Irregular menses       as period has been irregular with history of fibroids, will obtain HCG with labs   Relevant Orders   hCG, quantitative, pregnancy      Note: This dictation was prepared with Dragon dictation along with smaller phrase technology. Any transcriptional errors that result from this process are unintentional.

## 2018-12-26 LAB — LIPID PANEL
Cholesterol: 192 mg/dL (ref <200)
HDL: 82 mg/dL (ref >=50)
LDL Cholesterol (Calc): 94 mg/dL (calc)
Non-HDL Cholesterol (Calc): 110 mg/dL (calc) (ref <130)
Total CHOL/HDL Ratio: 2.3 (calc) (ref <5.0)
Triglycerides: 70 mg/dL (ref <150)

## 2018-12-26 LAB — CBC WITH DIFFERENTIAL/PLATELET
Absolute Monocytes: 357 cells/uL (ref 200–950)
Basophils Absolute: 52 cells/uL (ref 0–200)
Basophils Relative: 1.2 %
Eosinophils Absolute: 99 cells/uL (ref 15–500)
Eosinophils Relative: 2.3 %
HCT: 40.8 % (ref 35.0–45.0)
Hemoglobin: 13.5 g/dL (ref 11.7–15.5)
Lymphs Abs: 1458 cells/uL (ref 850–3900)
MCH: 31.8 pg (ref 27.0–33.0)
MCHC: 33.1 g/dL (ref 32.0–36.0)
MCV: 96 fL (ref 80.0–100.0)
MPV: 11 fL (ref 7.5–12.5)
Monocytes Relative: 8.3 %
Neutro Abs: 2335 cells/uL (ref 1500–7800)
Neutrophils Relative %: 54.3 %
Platelets: 314 10*3/uL (ref 140–400)
RBC: 4.25 10*6/uL (ref 3.80–5.10)
RDW: 13.1 % (ref 11.0–15.0)
Total Lymphocyte: 33.9 %
WBC: 4.3 10*3/uL (ref 3.8–10.8)

## 2018-12-26 LAB — HEMOGLOBIN A1C
Hgb A1c MFr Bld: 5.5 % of total Hgb (ref <5.7)
Mean Plasma Glucose: 111 (calc)
eAG (mmol/L): 6.2 (calc)

## 2018-12-26 LAB — COMPREHENSIVE METABOLIC PANEL
AG Ratio: 1.6 (calc) (ref 1.0–2.5)
ALT: 17 U/L (ref 6–29)
AST: 19 U/L (ref 10–30)
Albumin: 4.2 g/dL (ref 3.6–5.1)
Alkaline phosphatase (APISO): 67 U/L (ref 31–125)
BUN: 14 mg/dL (ref 7–25)
CO2: 25 mmol/L (ref 20–32)
Calcium: 9.2 mg/dL (ref 8.6–10.2)
Chloride: 105 mmol/L (ref 98–110)
Creat: 0.92 mg/dL (ref 0.50–1.10)
Globulin: 2.7 g/dL (calc) (ref 1.9–3.7)
Glucose, Bld: 98 mg/dL (ref 65–99)
Potassium: 4.1 mmol/L (ref 3.5–5.3)
Sodium: 142 mmol/L (ref 135–146)
Total Bilirubin: 0.5 mg/dL (ref 0.2–1.2)
Total Protein: 6.9 g/dL (ref 6.1–8.1)

## 2018-12-26 LAB — HCG, QUANTITATIVE, PREGNANCY: HCG, Total, QN: <3 m[IU]/mL

## 2019-01-04 ENCOUNTER — Ambulatory Visit (INDEPENDENT_AMBULATORY_CARE_PROVIDER_SITE_OTHER): Payer: 59 | Admitting: Family Medicine

## 2019-01-04 ENCOUNTER — Encounter: Payer: Self-pay | Admitting: Family Medicine

## 2019-01-04 VITALS — BP 166/110 | HR 80 | Temp 97.2°F | Resp 16 | Ht 67.5 in | Wt 343.0 lb

## 2019-01-04 DIAGNOSIS — S46811A Strain of other muscles, fascia and tendons at shoulder and upper arm level, right arm, initial encounter: Secondary | ICD-10-CM | POA: Diagnosis not present

## 2019-01-04 MED ORDER — MELOXICAM 15 MG PO TABS: 15.0000 mg | ORAL_TABLET | Freq: Every day | ORAL | 0 refills | Status: DC

## 2019-01-04 MED ORDER — TIZANIDINE HCL 4 MG PO TABS: 4.0000 mg | ORAL_TABLET | Freq: Four times a day (QID) | ORAL | 0 refills | Status: DC | PRN

## 2019-01-04 NOTE — Progress Notes (Signed)
Subjective:    Patient ID: Carla [name], female    DOB: May 15, 1980, 38 y.o.   MRN: [id number]  HPI  Approximately 1 week ago, the patient tripped over some items lying in the floor at her home.  She lost her balance and slowly fell to the floor and as she fell, she did not land on any object awkwardly however she twisted her upper back and she fell trying to prevent the fall.  Since that time, she has had pain and stiffness on the right side of her trapezius.  The pain is located below the level of C7 and to the right of the thoracic spine in the soft tissue area just medial and above and around the scapula.  She is tender to palpation in that area with palpable muscle spasms.  It hurts to try to shrug her shoulders.  It hurts to turn her head side to side.  She also reports pain and stiffness with range of motion in her right shoulder.  However she has no pain with abduction of her right shoulder and no pain with empty can testing.  She denies any cervical radiculopathy.  She has no cervical spinous process tenderness to palpation or thoracic spinous process tenderness to palpation.  She is not tender to palpation over the transverse processes either.  She denies any neuropathic pain. Past Medical History:  Diagnosis Date  . Allergy   . Asthma   . Fibroids   . Pregnancy induced hypertension    Past Surgical History:  Procedure Laterality Date  . CERVICAL CERCLAGE  2003, 2008  . CERVICAL CERCLAGE N/A 04/12/2016   Procedure: CERCLAGE CERVICAL;  Surgeon: Bobbye Charleston, MD;  Location: [facility];  Service: Gynecology;  Laterality: N/A;  . CERVICAL CERCLAGE N/A 09/23/2016   Procedure: removal CERCLAGE CERVICAL;  Surgeon: Sanjuana Kava, MD;  Location: [facility];  Service: Obstetrics;  Laterality: N/A;  . CESAREAN SECTION N/A 09/23/2016   Procedure: CESAREAN SECTION;  Surgeon: Sanjuana Kava, MD;  Location: [facility];  Service: Obstetrics;  Laterality: N/A;  RNFA  Tracey T  . DILATION AND  CURETTAGE OF UTERUS    . ectopic    . MYOMECTOMY  2016   fibroid removal   Current Outpatient Medications on File Prior to Visit  Medication Sig Dispense Refill  . albuterol (VENTOLIN HFA) 108 (90 Base) MCG/ACT inhaler Inhale 2 puffs into the lungs every 4 (four) hours as needed for wheezing or shortness of breath (before exercise). 18 g 2  . cetirizine (ZYRTEC) 10 MG tablet Take 1 tablet (10 mg total) by mouth daily as needed for allergies. 30 tablet 11  . Multiple Vitamin (MULTIVITAMIN WITH MINERALS) TABS tablet Take 1 tablet by mouth daily.     No current facility-administered medications on file prior to visit.    Allergies  Allergen Reactions  . Penicillins Hives and Nausea And Vomiting    Has patient had a PCN reaction causing immediate rash, facial/tongue/throat swelling, SOB or lightheadedness with hypotension: No Has patient had a PCN reaction causing severe rash involving mucus membranes or skin necrosis: Yes Has patient had a PCN reaction that required hospitalization No Has patient had a PCN reaction occurring within the last 10 years: No If all of the above answers are "NO", then may proceed with Cephalosporin use.   . Ibuprofen Palpitations  . Latex Rash   Social History   Socioeconomic History  . Marital status: Married    Spouse name: Not on file  .  Number of children: Not on file  . Years of education: Not on file  . Highest education level: Not on file  Occupational History  . Not on file  Social Needs  . Financial resource strain: Not on file  . Food insecurity    Worry: Not on file    Inability: Not on file  . Transportation needs    Medical: Not on file    Non-medical: Not on file  Tobacco Use  . Smoking status: Never Smoker  . Smokeless tobacco: Never Used  Substance and Sexual Activity  . Alcohol use: No  . Drug use: No  . Sexual activity: Yes    Birth control/protection: None  Lifestyle  . Physical activity    Days per week: Not on file     Minutes per session: Not on file  . Stress: Not on file  Relationships  . Social Herbalist on phone: Not on file    Gets together: Not on file    Attends religious service: Not on file    Active member of club or organization: Not on file    Attends meetings of clubs or organizations: Not on file    Relationship status: Not on file  . Intimate partner violence    Fear of current or ex partner: Not on file    Emotionally abused: Not on file    Physically abused: Not on file    Forced sexual activity: Not on file  Other Topics Concern  . Not on file  Social History Narrative  . Not on file     Review of Systems  All other systems reviewed and are negative.      Objective:   Physical Exam Constitutional:      Appearance: She is obese.  Cardiovascular:     Rate and Rhythm: Normal rate and regular rhythm.     Heart sounds: Normal heart sounds.  Pulmonary:     Effort: Pulmonary effort is normal.     Breath sounds: Normal breath sounds.  Musculoskeletal:     Cervical back: She exhibits tenderness, pain and spasm. She exhibits normal range of motion, no bony tenderness, no swelling and no deformity.       Back:  Neurological:     Mental Status: She is alert.           Assessment & Plan:  Trapezius strain, right, initial encounter  I believe the patient has strained her trapezius.  I recommended using meloxicam 15 mg a day daily until better and using tizanidine 4 mg every 6 hours as needed for muscle spasms.  The patient's blood pressure today is elevated however she states that she has whitecoat syndrome and she will check her blood pressure and it will be normal at home.  I have asked her to monitor this and follow-up with her PCP if no better next week

## 2019-01-07 ENCOUNTER — Telehealth: Payer: Self-pay | Admitting: Family Medicine

## 2019-01-07 ENCOUNTER — Other Ambulatory Visit: Payer: Self-pay

## 2019-01-07 NOTE — Telephone Encounter (Signed)
Patient calling to say that the med that was called in at her last visit is not working would like a call back at [phone]

## 2019-01-07 NOTE — Telephone Encounter (Signed)
Patient states that she is not feeling any improvement in Sx since starting Mobic and Zanaflex. Per chart notes, patient would require F/U if no better. Appointment scheduled with fellow MD as he initially evaluated back pain from fall.

## 2019-01-08 ENCOUNTER — Ambulatory Visit
Admission: RE | Admit: 2019-01-08 | Discharge: 2019-01-08 | Disposition: A | Discharge status: Home / Self Care | Payer: 59 | Source: Ambulatory Visit | Attending: Family Medicine | Admitting: Family Medicine

## 2019-01-08 ENCOUNTER — Ambulatory Visit (INDEPENDENT_AMBULATORY_CARE_PROVIDER_SITE_OTHER): Payer: 59 | Admitting: Family Medicine

## 2019-01-08 VITALS — BP 130/68 | HR 70 | Temp 98.3°F | Resp 18 | Ht 67.5 in | Wt 350.0 lb

## 2019-01-08 DIAGNOSIS — M5412 Radiculopathy, cervical region: Secondary | ICD-10-CM | POA: Diagnosis not present

## 2019-01-08 MED ORDER — PREDNISONE 20 MG PO TABS: ORAL_TABLET | ORAL | 0 refills | Status: DC

## 2019-01-08 MED ORDER — HYDROCODONE-ACETAMINOPHEN 5-325 MG PO TABS: 1.0000 | ORAL_TABLET | Freq: Four times a day (QID) | ORAL | 0 refills | Status: DC | PRN

## 2019-01-08 NOTE — Progress Notes (Signed)
Subjective:    Patient ID: Carla [name], female    DOB: 10-29-1980, 38 y.o.   MRN: [id number]  HPI 01/04/19 Approximately 1 week ago, the patient tripped over some items lying in the floor at her home.  She lost her balance and slowly fell to the floor and as she fell, she did not land on any object awkwardly however she twisted her upper back and she fell trying to prevent the fall.  Since that time, she has had pain and stiffness on the right side of her trapezius.  The pain is located below the level of C7 and to the right of the thoracic spine in the soft tissue area just medial and above and around the scapula.  She is tender to palpation in that area with palpable muscle spasms.  It hurts to try to shrug her shoulders.  It hurts to turn her head side to side.  She also reports pain and stiffness with range of motion in her right shoulder.  However she has no pain with abduction of her right shoulder and no pain with empty can testing.  She denies any cervical radiculopathy.  She has no cervical spinous process tenderness to palpation or thoracic spinous process tenderness to palpation.  She is not tender to palpation over the transverse processes either.  She denies any neuropathic pain.  At that time, my plan was: I believe the patient has strained her trapezius.  I recommended using meloxicam 15 mg a day daily until better and using tizanidine 4 mg every 6 hours as needed for muscle spasms.  The patient's blood pressure today is elevated however she states that she has whitecoat syndrome and she will check her blood pressure and it will be normal at home.  I have asked her to monitor this and follow-up with her PCP if no better next week  01/08/19 Patient's pain is worsening.  It is also changing in nature.  She now reports pain radiating from the right side of her neck into her right posterior shoulder and down her right arm all the way into her fingertips.  She also reports numbness and tingling  radiating down her right arm at times into her fingertips.  Nothing is helping.  The meloxicam is not helping.  The muscle relaxer seems to be doing nothing.  The pain occurs no matter what she does.  The last 2 nights she is been unable to sleep due to the aching throbbing pain radiating from her neck down her right arm.  She denies any weakness in the right arm.  She denies any leg weakness or muscle paralysis Past Medical History:  Diagnosis Date  . Allergy   . Asthma   . Fibroids   . Pregnancy induced hypertension    Past Surgical History:  Procedure Laterality Date  . CERVICAL CERCLAGE  2003, 2008  . CERVICAL CERCLAGE N/A 04/12/2016   Procedure: CERCLAGE CERVICAL;  Surgeon: Bobbye Charleston, MD;  Location: [facility];  Service: Gynecology;  Laterality: N/A;  . CERVICAL CERCLAGE N/A 09/23/2016   Procedure: removal CERCLAGE CERVICAL;  Surgeon: Sanjuana Kava, MD;  Location: [facility];  Service: Obstetrics;  Laterality: N/A;  . CESAREAN SECTION N/A 09/23/2016   Procedure: CESAREAN SECTION;  Surgeon: Sanjuana Kava, MD;  Location: [facility];  Service: Obstetrics;  Laterality: N/A;  RNFA  Tracey T  . DILATION AND CURETTAGE OF UTERUS    . ectopic    . MYOMECTOMY  2016   fibroid  removal   Current Outpatient Medications on File Prior to Visit  Medication Sig Dispense Refill  . albuterol (VENTOLIN HFA) 108 (90 Base) MCG/ACT inhaler Inhale 2 puffs into the lungs every 4 (four) hours as needed for wheezing or shortness of breath (before exercise). 18 g 2  . cetirizine (ZYRTEC) 10 MG tablet Take 1 tablet (10 mg total) by mouth daily as needed for allergies. 30 tablet 11  . meloxicam (MOBIC) 15 MG tablet Take 1 tablet (15 mg total) by mouth daily. 30 tablet 0  . Multiple Vitamin (MULTIVITAMIN WITH MINERALS) TABS tablet Take 1 tablet by mouth daily.    Marland Kitchen tiZANidine (ZANAFLEX) 4 MG tablet Take 1 tablet (4 mg total) by mouth every 6 (six) hours as needed for muscle spasms. 30 tablet 0   No  current facility-administered medications on file prior to visit.    Allergies  Allergen Reactions  . Penicillins Hives and Nausea And Vomiting    Has patient had a PCN reaction causing immediate rash, facial/tongue/throat swelling, SOB or lightheadedness with hypotension: No Has patient had a PCN reaction causing severe rash involving mucus membranes or skin necrosis: Yes Has patient had a PCN reaction that required hospitalization No Has patient had a PCN reaction occurring within the last 10 years: No If all of the above answers are "NO", then may proceed with Cephalosporin use.   . Ibuprofen Palpitations  . Latex Rash   Social History   Socioeconomic History  . Marital status: Married    Spouse name: Not on file  . Number of children: Not on file  . Years of education: Not on file  . Highest education level: Not on file  Occupational History  . Not on file  Social Needs  . Financial resource strain: Not on file  . Food insecurity    Worry: Not on file    Inability: Not on file  . Transportation needs    Medical: Not on file    Non-medical: Not on file  Tobacco Use  . Smoking status: Never Smoker  . Smokeless tobacco: Never Used  Substance and Sexual Activity  . Alcohol use: No  . Drug use: No  . Sexual activity: Yes    Birth control/protection: None  Lifestyle  . Physical activity    Days per week: Not on file    Minutes per session: Not on file  . Stress: Not on file  Relationships  . Social Herbalist on phone: Not on file    Gets together: Not on file    Attends religious service: Not on file    Active member of club or organization: Not on file    Attends meetings of clubs or organizations: Not on file    Relationship status: Not on file  . Intimate partner violence    Fear of current or ex partner: Not on file    Emotionally abused: Not on file    Physically abused: Not on file    Forced sexual activity: Not on file  Other Topics Concern   . Not on file  Social History Narrative  . Not on file     Review of Systems  Musculoskeletal: Positive for back pain.  All other systems reviewed and are negative.      Objective:   Physical Exam Constitutional:      Appearance: She is obese.  Cardiovascular:     Rate and Rhythm: Normal rate and regular rhythm.     Heart  sounds: Normal heart sounds.  Pulmonary:     Effort: Pulmonary effort is normal.     Breath sounds: Normal breath sounds.  Musculoskeletal:     Cervical back: She exhibits tenderness and pain. She exhibits normal range of motion, no bony tenderness, no swelling, no deformity and no spasm.       Back:  Neurological:     Mental Status: She is alert.           Assessment & Plan:  Acute cervical radiculopathy - Plan: DG Cervical Spine Complete  Discontinue muscle relaxer.  Temporarily stop meloxicam.  I suspect that the patient may have herniated a disc in her neck.  Begin a prednisone taper pack immediately to try to reduce the swelling and the suspected herniated disc.  Obtain x-ray of the cervical spine.  She can use Norco 5/325 1 p.o. every 6 hours as needed breakthrough pain.  Once she is off the prednisone I want her to resume the meloxicam.  Reassess in 1 week or sooner if pain is worsening

## 2019-01-27 ENCOUNTER — Other Ambulatory Visit: Payer: Self-pay | Admitting: Family Medicine

## 2019-01-30 ENCOUNTER — Encounter: Payer: Self-pay | Admitting: Family Medicine

## 2019-01-30 ENCOUNTER — Ambulatory Visit (INDEPENDENT_AMBULATORY_CARE_PROVIDER_SITE_OTHER): Payer: 59 | Admitting: Family Medicine

## 2019-01-30 VITALS — BP 136/96 | HR 98 | Temp 98.4°F | Resp 14 | Ht 67.5 in | Wt 342.0 lb

## 2019-01-30 DIAGNOSIS — R6889 Other general symptoms and signs: Secondary | ICD-10-CM

## 2019-01-30 DIAGNOSIS — R030 Elevated blood-pressure reading, without diagnosis of hypertension: Secondary | ICD-10-CM | POA: Diagnosis not present

## 2019-01-30 NOTE — Progress Notes (Signed)
Subjective:    Patient ID: Carla [name], female    DOB: 1980-04-03, 38 y.o.   MRN: [id number]  Patient presents for Sinus Pressure (x1 month- pressure in head and ears constantly pop)    She has a  " funny" sensation in her head . A Foggy feeling, ears feel like they may need to pop on occ but no allergy symptoms. She states she gets a pulsation in her head   Near her temples as well. But no actual HA. Only happens when she sitting. When laying down does not have the sensation. Now has spells 2-3 times a day, She is at computer long hours a day teaching No sinus pressure, no drainage, no fever, no change in vision Not taking any meds Her symptoms did start around the same time as her neck pain which was really recently evaluated.Marland Kitchen  Her neck pain has improved if she holds her head in different positions and tries to avoid leaning to the side.  She wants to hold off and get an MRI at this time there was some degenerative disc disease in her C-spine concern for possible nerve impingement.  She denies any new tingling or numbness into the upper extremities   Review Of Systems:  GEN- denies fatigue, fever, weight loss,weakness, recent illness HEENT- denies eye drainage, change in vision, nasal discharge, CVS- denies chest pain, palpitations RESP- denies SOB, cough, wheeze ABD- denies N/V, change in stools, abd pain GU- denies dysuria, hematuria, dribbling, incontinence MSK- denies joint pain, muscle aches, injury Neuro- denies headache, dizziness, syncope, seizure activity       Objective:    BP (!) 136/96   Pulse 98   Temp 98.4 F (36.9 C) (Temporal)   Resp 14   Ht 5' 7.5" (1.715 m)   Wt (!) 342 lb (155.1 kg)   SpO2 97%   BMI 52.77 kg/m  GEN- NAD, alert and oriented x3 HEENT- PERRL, EOMI, non injected sclera, pink conjunctiva, MMM, oropharynx clear, nares clear TM clear bilateral no effusion no sinus tenderness Neck- Supple, good range of motion nontender on C-spine, no  bruit CVS- RRR, no murmur RESP-CTAB Neuro cranial nerves II through XII intact EXT- No edema Pulses- Radial, 2+        Assessment & Plan:      Problem List Items Addressed This Visit    None    Visit Diagnoses    Elevated blood pressure reading    -  Primary   Difficult to pinpoint the sensation she feels her head, this could be due to her elevated BP, also sleep deprived sitting at computer many hours on end  Possible some of this is coming from her neck discomfort though it is not posteriorly like a tension migraine you would expect.  We discussed monitoring her blood pressure at home see if this corresponds with the episodes.  We discussed dietary changes that she can make to continue with weight loss and decrease her blood pressure naturally.  If her blood pressure is is consistently greater than 140/90 at home I would start her on HCTZ 25 mg once a day.  This time she wants to hold off on the MRI of her neck as her pain and discomfort has improved. States she can use topical rubs of the neck she can also use ibuprofen or Tylenol as needed.  Guards to her weight is he is interested in bariatric surgery.  Discussed with her going on a local website to do the webinar.  I will go ahead and place a consult for bariatric surgery She has tried phentermine in the past for weight loss but had side effects she is tried low-carb diet.  She is tried exercise on her own.  We also discussed Saxenda she will see if her insurance covers this medication   Sensory problem of head    Morbid obesity       Note: This dictation was prepared with Dragon dictation along with smaller phrase technology. Any transcriptional errors that result from this process are unintentional.

## 2019-01-30 NOTE — Patient Instructions (Addendum)
Kirke Shaggy is the weight loss medication  [facility] website- Virtual Seminar  Referral for consult for weight loss surgery  Check your blood pressure Call if is staying above  > 140/90 F/U as needed

## 2019-03-15 ENCOUNTER — Encounter: Payer: Self-pay | Admitting: Family Medicine

## 2019-03-15 ENCOUNTER — Other Ambulatory Visit: Payer: Self-pay

## 2019-03-15 ENCOUNTER — Ambulatory Visit (INDEPENDENT_AMBULATORY_CARE_PROVIDER_SITE_OTHER): Payer: 59 | Admitting: Family Medicine

## 2019-03-15 NOTE — Progress Notes (Signed)
   Subjective:    Patient ID: Carla [name], female    DOB: 01/16/1981, 38 y.o.   MRN: [id number]  Patient presents for Forms (wellness forms) and Weight Loss (needs supervised visits for weight loss)   She has form for her wellness visit.  She also needs forms completed for her upcoming bariatric surgery.  She has appt scheduled with Dr. Lucia Gaskins 12/23 at [facility] surgery. She is struggle with her weight most of her adult life.  She is currently at her heaviest weight of 349 pounds.  She has been on weight loss medications last in 2017 but was also prescribed by another physician prior to establishing care with me.  Through the years she has tried multiple programs to help her lose weight and supplements:   She has tried Weight watchers- minimal weight loss- lost 5- 10 pounds, after months on the diet regained aftewrads   Phetermine multiple times-  Last  2017, no significant weight loss   Slim fast program   Low carb diet / along with exercise   Cut out all meat/ fried items -lost 16llbs but regained this was back in 2018   Currently- she has increased her water intake, she has cut down on fried foods, more aactivity with her toddler     No acid reflux problems, no constipation problems       Family history reviewed   Review Of Systems:  GEN- denies fatigue, fever, weight loss,weakness, recent illness HEENT- denies eye drainage, change in vision, nasal discharge, CVS- denies chest pain, palpitations RESP- denies SOB, cough, wheeze ABD- denies N/V, change in stools, abd pain GU- denies dysuria, hematuria, dribbling, incontinence MSK- denies joint pain, muscle aches, injury Neuro- denies headache, dizziness, syncope, seizure activity       Objective:    BP 138/90   Pulse 90   Temp 98.6 F (37 C) (Temporal)   Resp 14   Ht 5' 7.5" (1.715 m)   Wt (!) 349 lb (158.3 kg)   SpO2 94%   BMI 53.85 kg/m  GEN- NAD, alert and oriented x3 HEENT- PERRL, EOMI, non injected  sclera, pink conjunctiva, Neck- Supple, no thyromegaly CVS- RRR, no murmur RESP-CTAB EXT- No edema  Pulses- Radial 2+        Assessment & Plan:      Problem List Items Addressed This Visit      Unprioritized   Morbid obesity ([place]) - Primary    I think she would be a good candidate for bariatric weight loss surgery.  This will help reduce her risk of comorbidities including hypertension the development of diabetes mellitus or heart disease.  She already has history of mild hyperlipidemia.  I have written a letter of recommendation for her consultation.  She has tried multiple other modalities to help her lose weight and have been unsuccessful.  F/U in 2 months for weight loss Continue with lower carb, high fiber (veggies) , increased water intake          Note: This dictation was prepared with Dragon dictation along with smaller phrase technology. Any transcriptional errors that result from this process are unintentional.

## 2019-03-15 NOTE — Patient Instructions (Signed)
F/U 2 months  

## 2019-03-15 NOTE — Assessment & Plan Note (Addendum)
I think she would be a good candidate for bariatric weight loss surgery.  This will help reduce her risk of comorbidities including hypertension the development of diabetes mellitus or heart disease.  She already has history of mild hyperlipidemia.  I have written a letter of recommendation for her consultation.  She has tried multiple other modalities to help her lose weight and have been unsuccessful.  F/U in 2 months for weight loss Continue with lower carb, high fiber (veggies) , increased water intake

## 2019-03-27 ENCOUNTER — Ambulatory Visit (HOSPITAL_COMMUNITY)
Admission: RE | Admit: 2019-03-27 | Discharge: 2019-03-27 | Disposition: A | Discharge status: Home / Self Care | Payer: 59 | Source: Ambulatory Visit | Attending: Family Medicine | Admitting: Family Medicine

## 2019-03-27 ENCOUNTER — Ambulatory Visit (INDEPENDENT_AMBULATORY_CARE_PROVIDER_SITE_OTHER): Payer: 59 | Admitting: Family Medicine

## 2019-03-27 ENCOUNTER — Encounter: Payer: Self-pay | Admitting: Family Medicine

## 2019-03-27 ENCOUNTER — Other Ambulatory Visit: Payer: Self-pay

## 2019-03-27 VITALS — BP 138/82 | HR 80 | Temp 98.6°F | Resp 14 | Ht 67.5 in | Wt 349.0 lb

## 2019-03-27 DIAGNOSIS — M79661 Pain in right lower leg: Secondary | ICD-10-CM | POA: Diagnosis present

## 2019-03-27 DIAGNOSIS — M79604 Pain in right leg: Secondary | ICD-10-CM

## 2019-03-27 NOTE — Patient Instructions (Signed)
Ultrasound to be done F/U as previous

## 2019-03-27 NOTE — Progress Notes (Signed)
   Subjective:    Patient ID: Carla [name], female    DOB: [birth year], 38 y.o.   MRN: [id number]  Patient presents for Leg Pain, Right (intermittent episodes of pain and feeling of fullness to back of leg)   Right leg pain and fullness behind the back of knee intermittantly for the past 6 months. First episode was back in Spring, and lasted few weeks  No painwith walking  No swelling of lower leg, but pain does travel to calf region  NO redness or warmth of leg   No knee pain  No known injury No back pain She had recurrent episode this past weekend that lasted 4 days, she took ASA 100mg  every day and that helped      Review Of Systems:  GEN- denies fatigue, fever, weight loss,weakness, recent illness HEENT- denies eye drainage, change in vision, nasal discharge, CVS- denies chest pain, palpitations RESP- denies SOB, cough, wheeze ABD- denies N/V, change in stools, abd pain GU- denies dysuria, hematuria, dribbling, incontinence MSK- denies joint pain,+ muscle aches, injury Neuro- denies headache, dizziness, syncope, seizure activity        Objective:    BP 138/82   Pulse 80   Temp 98.6 F (37 C) (Temporal)   Resp 14   Ht 5' 7.5" (1.715 m)   Wt (!) 349 lb (158.3 kg)   SpO2 97%   BMI 53.85 kg/m  GEN- NAD, alert and oriented x3 CVS- RRR, no murmur RESP-CTAB EXT- No edema  MSK- Bilat knee, NT, good ROM, no apparent swelling but knee has larger habitus, neg homans sign Pulses- Radial 2+               Assessment & Plan:      Problem List Items Addressed This Visit    None    Visit Diagnoses    Acute leg pain, right    -  Primary   unclear cause, DD include bakers cyst, doubt DVT as intermittant and no noticable swelling, no cellulitis, possible knee pathology but no knee pain or pain with walking MSK cramps could also be cause Will proceed with [place] r/o cyst/DVT first    Relevant Orders   US Venous Img Lower Unilateral Right (Completed)   Right calf pain        Relevant Orders   US Venous Img Lower Unilateral Right (Completed)      Note: This dictation was prepared with Dragon dictation along with smaller phrase technology. Any transcriptional errors that result from this process are unintentional.

## 2019-04-19 ENCOUNTER — Other Ambulatory Visit: Payer: Self-pay | Admitting: Surgery

## 2019-04-19 ENCOUNTER — Other Ambulatory Visit (HOSPITAL_COMMUNITY): Payer: Self-pay | Admitting: Surgery

## 2019-04-30 ENCOUNTER — Encounter: Payer: 59 | Admitting: Family Medicine

## 2019-05-03 ENCOUNTER — Ambulatory Visit (HOSPITAL_COMMUNITY)
Admission: RE | Admit: 2019-05-03 | Discharge: 2019-05-03 | Disposition: A | Discharge status: Home / Self Care | Payer: 59 | Source: Ambulatory Visit | Attending: Surgery | Admitting: Surgery

## 2019-05-03 ENCOUNTER — Other Ambulatory Visit: Payer: Self-pay

## 2019-05-14 ENCOUNTER — Other Ambulatory Visit: Payer: Self-pay

## 2019-05-14 ENCOUNTER — Encounter: Payer: Self-pay | Admitting: Dietician

## 2019-05-14 ENCOUNTER — Encounter: Payer: 59 | Attending: Surgery | Admitting: Dietician

## 2019-05-14 DIAGNOSIS — E669 Obesity, unspecified: Secondary | ICD-10-CM | POA: Diagnosis present

## 2019-05-14 NOTE — Progress Notes (Signed)
Nutrition Assessment for Bariatric Surgery Medical Nutrition Therapy  Appt Start Time: 4:15pm    End Time: 5:15pm  Patient was seen on 05/14/2019 for Pre-Operative Nutrition Assessment. Letter of approval faxed to [facility] bariatric surgery program coordinator on 05/14/2019.   Referral stated Supervised Weight Loss (SWL) visits needed: 6 (Patient states she has already completed these with her PCP)   Planned surgery: Sleeve Gastrectomy  Pt expectation of surgery: to be healthier and live a longer life to have time with grandchildren   NUTRITION ASSESSMENT   Anthropometrics  Start weight at [facility]: 350.9 lbs (date: 05/14/2019) Height: 67.5 in BMI: 54.2 kg/m2     Lifestyle & Dietary Hx Typical meal pattern is 2 meals per day. Does not snack usually, but may have grapes. Prefers meat, usually will have this for a meal with no sides, such as [place] wings, deli meat, fried chicken, etc.    24-Hr Dietary Recall First Meal: - Snack: - Second Meal: 1/2 steak and cheese sandwich + cheesecake pastry  Snack: - Third Meal: fried chicken + potatoes  Snack: - Beverages: water, juice, decaf orange soda, tea   Estimated Energy Needs Calories: 1800 Carbohydrate: 200g Protein: 113g Fat: 60g   NUTRITION DIAGNOSIS  Overweight/obesity ([place]-3.3) related to past poor dietary habits and physical inactivity as evidenced by patient w/ planned Sleeve Gastrectomy surgery following dietary guidelines for continued weight loss.    NUTRITION INTERVENTION  Nutrition counseling (C-1) and education (E-2) to facilitate bariatric surgery goals.  Pre-Op Goals Reviewed with the Patient . Track food and beverage intake (pen and paper, MyFitness Pal, Baritastic app, etc.) . Make healthy food choices while monitoring portion sizes . Consume 3 meals per day or try to eat every 3-5 hours . Avoid concentrated sugars and fried foods . Keep sugar & fat in the single digits per serving on food  labels . Practice CHEWING your food (aim for applesauce consistency) . Practice not drinking 15 minutes before, during, and 30 minutes after each meal and snack . Avoid all carbonated beverages (ex: soda, sparkling beverages)  . Limit caffeinated beverages (ex: coffee, tea, energy drinks) . Avoid all sugar-sweetened beverages (ex: regular soda, sports drinks)  . Avoid alcohol  . Aim for 64-100 ounces of FLUID daily (with at least half of fluid intake being plain water)  . Aim for at least 60-80 grams of PROTEIN daily . Look for a liquid protein source that contains ?15 g protein and ?5 g carbohydrate (ex: shakes, drinks, shots) . Make a list of non-food related activities . Physical activity is an important part of a healthy lifestyle so keep it moving! The goal is to reach 150 minutes of exercise per week, including cardiovascular and weight baring activity.  Handouts Provided Include  . Bariatric Surgery handouts (Nutrition Visits, Pre-Op Goals, Protein Shakes, Vitamins & Minerals)  Learning Style & Readiness for Change Teaching method utilized: Visual & Auditory  Demonstrated degree of understanding via: Teach Back  Barriers to learning/adherence to lifestyle change: None Identified    MONITORING & EVALUATION Dietary intake, weekly physical activity, body weight, and pre-op goals reached at next nutrition visit.   Next Steps Patient is to return to [facility] in 1 month for 1st SWL Visit, if needed. Patient states she has completed her SWL requirements with her PCP so these may already be fulfilled and patient will need to return for [facility] for Pre-Op Class 2 weeks prior to surgery.

## 2019-05-17 ENCOUNTER — Ambulatory Visit: Payer: 59 | Admitting: Family Medicine

## 2019-06-11 ENCOUNTER — Other Ambulatory Visit: Payer: Self-pay

## 2019-06-11 ENCOUNTER — Encounter: Payer: 59 | Attending: Surgery | Admitting: Dietician

## 2019-06-11 DIAGNOSIS — E669 Obesity, unspecified: Secondary | ICD-10-CM | POA: Insufficient documentation

## 2019-06-11 NOTE — Progress Notes (Signed)
Supervised Weight Loss Visit Bariatric Nutrition Education  Planned Surgery: Sleeve  Pt Expectation of Surgery/ Goals: to be healthier and live a longer life to have time with grandchildren  5th out of 6 SWL Appointments   (4 already completed with PCP prior to nutrition assessment at [facility])   NUTRITION ASSESSMENT  Anthropometrics  Start weight at [facility]: 350.9 lbs (date: 05/14/2019) Today's weight: 349.9 lbs BMI: 54 kg/m2    Lifestyle & Dietary Hx Patient states typical meal pattern is 2 meals plus 2 snacks per day. May have a donut or tangerine for snacks, meals typically include a meat (chicken or steak or hamburger or [place] burger or pork chop) and starch. States she would like to have more vegetables included in her diet. Been avoiding carbonated beverages and fruit juice   Current average weekly physical activity: walking outdoors (weather permitting)   [id number] Dietary Recall First Meal: - Snack: donut  Second Meal: chicken nuggets + fries  Snack: tangerine Third Meal: chicken + potatoes  Snack: - Beverages: coffee, water  Estimated Energy Needs Calories: 1800 Carbohydrate: 200g Protein: 113g Fat: 60g   NUTRITION DIAGNOSIS  Overweight/obesity ([place]-3.3) related to past poor dietary habits and physical inactivity as evidenced by patient w/ planned Sleeve Gastrectomy surgery following dietary guidelines for continued weight loss.   NUTRITION INTERVENTION  Nutrition counseling (C-1) and education (E-2) to facilitate bariatric surgery goals.  Pre-Op Goals Progress & New Goals . Avoiding carbonated and sugar sweetened beverages.  . Drinking lots of water.   NEW: Avoid drinking 15 minutes before, during, and 30 minutes after each meal/snack.   NEW: Aim to eat at least 1 serving of vegetables per day.   Handouts Provided Include   Pre-Op Goals   Learning Style & Readiness for Change Teaching method utilized: Visual & Auditory  Demonstrated degree of understanding  via: Teach Back  Barriers to learning/adherence to lifestyle change: None Identified    MONITORING & EVALUATION Dietary intake, weekly physical activity, body weight, and pre-op goals in 1 month.   Next Steps  Patient is to return to [facility] in 1 month for 6th SWL Visit.

## 2019-06-11 NOTE — Patient Instructions (Signed)
   Avoid drinking 15 minutes before, during, and 30 minutes after each meal/snack. Continue drinking lots of fluids, especially water... great job with this!   Aim to eat at least 1 serving of vegetables per day.

## 2019-06-12 ENCOUNTER — Encounter: Payer: Self-pay | Admitting: Dietician

## 2019-07-09 ENCOUNTER — Encounter: Payer: Self-pay | Admitting: Dietician

## 2019-07-09 ENCOUNTER — Other Ambulatory Visit: Payer: Self-pay

## 2019-07-09 ENCOUNTER — Encounter: Payer: 59 | Attending: Surgery | Admitting: Dietician

## 2019-07-09 DIAGNOSIS — E669 Obesity, unspecified: Secondary | ICD-10-CM | POA: Insufficient documentation

## 2019-07-09 NOTE — Progress Notes (Signed)
Supervised Weight Loss Visit Bariatric Nutrition Education  Planned Surgery: Sleeve  Pt Expectation of Surgery/ Goals: to be healthier and live a longer life to have time with grandchildren  6th out of 6 SWL Appointments     NUTRITION ASSESSMENT  Anthropometrics  Start weight at [facility]: 350.9 lbs (date: 05/14/2019) Today's weight: 350 lbs BMI: 54 kg/m2    Lifestyle & Dietary Hx Busy work schedule, long work days as a Pharmacist, hospital. Typical meal pattern is 2 meals plus 2 snacks per day. May have a dessert/sweet or fruit for snacks, meals typically include a meat (chicken or steak or hamburger or [place] burger or pork chop) and starch. States she has not been eating as many greens as she would like, doesn't get them in daily. Drinks mostly water and has been reducing intake of coffee. Occasionally drinks some sweet tea such as if gets take out/fast food.   Estimated daily fluid intake: 48 oz  Current average weekly physical activity: walking outdoors (weather permitting)   [id number] Dietary Recall First Meal: leftover chicken & waffles Snack: grapes  Second Meal: - Snack: pineapple  Third Meal: burger + 3 chicken nuggets + hush puppies  Snack: - Beverages: coffee, water, sweet tea  Estimated Energy Needs Calories: 1800 Carbohydrate: 200g Protein: 113g Fat: 60g   NUTRITION DIAGNOSIS  Overweight/obesity ([place]-3.3) related to past poor dietary habits and physical inactivity as evidenced by patient w/ planned Sleeve Gastrectomy surgery following dietary guidelines for continued weight loss.   NUTRITION INTERVENTION  Nutrition counseling (C-1) and education (E-2) to facilitate bariatric surgery goals.  Pre-Op Goals Progress & New Goals . Avoiding carbonated and sugar sweetened beverages.  . Drinking lots of water.   Working on not drinking 15 minutes before, during, and 30 minutes after each meal/snack.   Working on eating at least 1 serving of vegetables per day.   NEW: Chew each bite  of food until applesauce consistency.  Learning Style & Readiness for Change Teaching method utilized: Visual & Auditory  Demonstrated degree of understanding via: Teach Back  Barriers to learning/adherence to lifestyle change: None Identified    MONITORING & EVALUATION Dietary intake, weekly physical activity, body weight, and pre-op goals at next nutrition visit.   Next Steps  Patient is to return to [facility] for Pre-Op Class >2 weeks before surgery.

## 2019-07-30 ENCOUNTER — Ambulatory Visit (INDEPENDENT_AMBULATORY_CARE_PROVIDER_SITE_OTHER): Payer: 59 | Admitting: Family Medicine

## 2019-07-30 ENCOUNTER — Other Ambulatory Visit: Payer: Self-pay

## 2019-07-30 ENCOUNTER — Encounter: Payer: Self-pay | Admitting: Family Medicine

## 2019-07-30 VITALS — BP 138/86 | HR 84 | Temp 98.3°F | Resp 16 | Ht 67.5 in | Wt 355.0 lb

## 2019-07-30 DIAGNOSIS — E669 Obesity, unspecified: Secondary | ICD-10-CM | POA: Diagnosis not present

## 2019-07-30 DIAGNOSIS — J4520 Mild intermittent asthma, uncomplicated: Secondary | ICD-10-CM

## 2019-07-30 DIAGNOSIS — Z0000 Encounter for general adult medical examination without abnormal findings: Secondary | ICD-10-CM

## 2019-07-30 DIAGNOSIS — Z0001 Encounter for general adult medical examination with abnormal findings: Secondary | ICD-10-CM | POA: Diagnosis not present

## 2019-07-30 MED ORDER — ALBUTEROL SULFATE HFA 108 (90 BASE) MCG/ACT IN AERS: 2.0000 | INHALATION_SPRAY | RESPIRATORY_TRACT | 2 refills | Status: DC | PRN

## 2019-07-30 NOTE — Patient Instructions (Addendum)
[facility]  F/u as needed

## 2019-07-30 NOTE — Progress Notes (Signed)
   Subjective:    Patient ID: Carla [name], female    DOB: 1980/07/30, 39 y.o.   MRN: [id number]  Patient presents for Annual Exam (is not fasting) Pt here for CPE Medications reviewed  She is currently in the process getting bariatric surgery , has preop next Monday    Taking MVI some days  Working long hours with 2 different professor jobs   Running around with her toddler    Dr. Alwyn Pea- had abnormal PAP , had biopsy which was benign, regular menses  no family history of breast cancer    Taking Junel OCP     No family history of colon cancer    Lipids normal in Sept 2020   Has strong family history of diabetes    Vision normal Dentist every 6 months    Immunizations- COVID-19 vaccine UTD   TDAP UTD            Review Of Systems:  GEN- denies fatigue, fever, weight loss,weakness, recent illness HEENT- denies eye drainage, change in vision, nasal discharge, CVS- denies chest pain, palpitations RESP- denies SOB, cough, wheeze ABD- denies N/V, change in stools, abd pain GU- denies dysuria, hematuria, dribbling, incontinence MSK- denies joint pain, muscle aches, injury Neuro- denies headache, dizziness, syncope, seizure activity       Objective:    BP 138/86   Pulse 84   Temp 98.3 F (36.8 C) (Temporal)   Resp 16   Ht 5' 7.5" (1.715 m)   Wt (!) 355 lb (161 kg)   LMP 07/03/2019 Comment: regular  BMI 54.78 kg/m  GEN- NAD, alert and oriented x3 HEENT- PERRL, EOMI, non injected sclera, pink conjunctiva, MMM, oropharynx clear Neck- Supple, no thyromegaly CVS- RRR, no murmur RESP-CTAB ABD-NABS,soft,NT,ND EXT- No edema Pulses- Radial, DP- 2+   CAGE/FALL/DEPRESSION screen neg      Assessment & Plan:      Problem List Items Addressed This Visit      Unprioritized   Asthma   Relevant Medications   albuterol (VENTOLIN HFA) 108 (90 Base) MCG/ACT inhaler   Obesity   Relevant Orders   Hemoglobin A1c    Other Visit Diagnoses    Routine general medical  examination at a health care facility    -  Primary   CPE done, recent lipid normal, check [id number], cbc/metabolic, proceed with bariatric surgery, abuterol refilled, obtain notes from GYN   Relevant Orders   Comprehensive metabolic panel   CBC with Differential/Platelet   Hemoglobin A1c      Note: This dictation was prepared with Dragon dictation along with smaller phrase technology. Any transcriptional errors that result from this process are unintentional.

## 2019-07-31 LAB — COMPREHENSIVE METABOLIC PANEL
AG Ratio: 1.7 (calc) (ref 1.0–2.5)
ALT: 15 U/L (ref 6–29)
AST: 13 U/L (ref 10–30)
Albumin: 4.2 g/dL (ref 3.6–5.1)
Alkaline phosphatase (APISO): 62 U/L (ref 31–125)
BUN: 16 mg/dL (ref 7–25)
CO2: 29 mmol/L (ref 20–32)
Calcium: 9.1 mg/dL (ref 8.6–10.2)
Chloride: 103 mmol/L (ref 98–110)
Creat: 0.8 mg/dL (ref 0.50–1.10)
Globulin: 2.5 g/dL (calc) (ref 1.9–3.7)
Glucose, Bld: 92 mg/dL (ref 65–99)
Potassium: 4.4 mmol/L (ref 3.5–5.3)
Sodium: 138 mmol/L (ref 135–146)
Total Bilirubin: 0.3 mg/dL (ref 0.2–1.2)
Total Protein: 6.7 g/dL (ref 6.1–8.1)

## 2019-07-31 LAB — CBC WITH DIFFERENTIAL/PLATELET
Absolute Monocytes: 321 cells/uL (ref 200–950)
Basophils Absolute: 51 cells/uL (ref 0–200)
Basophils Relative: 1 %
Eosinophils Absolute: 82 cells/uL (ref 15–500)
Eosinophils Relative: 1.6 %
HCT: 38.9 % (ref 35.0–45.0)
Hemoglobin: 13.1 g/dL (ref 11.7–15.5)
Lymphs Abs: 1306 cells/uL (ref 850–3900)
MCH: 31.9 pg (ref 27.0–33.0)
MCHC: 33.7 g/dL (ref 32.0–36.0)
MCV: 94.6 fL (ref 80.0–100.0)
MPV: 11 fL (ref 7.5–12.5)
Monocytes Relative: 6.3 %
Neutro Abs: 3341 cells/uL (ref 1500–7800)
Neutrophils Relative %: 65.5 %
Platelets: 326 10*3/uL (ref 140–400)
RBC: 4.11 10*6/uL (ref 3.80–5.10)
RDW: 13.6 % (ref 11.0–15.0)
Total Lymphocyte: 25.6 %
WBC: 5.1 10*3/uL (ref 3.8–10.8)

## 2019-07-31 LAB — HEMOGLOBIN A1C
Hgb A1c MFr Bld: 5.4 % of total Hgb (ref <5.7)
Mean Plasma Glucose: 108 (calc)
eAG (mmol/L): 6 (calc)

## 2019-08-05 ENCOUNTER — Encounter: Payer: 59 | Attending: Surgery | Admitting: Skilled Nursing Facility1

## 2019-08-05 ENCOUNTER — Other Ambulatory Visit: Payer: Self-pay

## 2019-08-05 DIAGNOSIS — E669 Obesity, unspecified: Secondary | ICD-10-CM | POA: Diagnosis not present

## 2019-08-05 NOTE — Progress Notes (Signed)
Pre-Operative Nutrition Class:  Appt start time: 2641   End time:  1830.  Patient was seen on 08/05/2019 for Pre-Operative [facility]ariatric Surgery [facility]    Surgery date:  Surgery type: sleeve [facility] at [facility]: 350.9 Weight today: pt arrived too late   The following the learning objectives were met by the patient during this course:  Identify Pre-Op Dietary Goals and will begin 2 weeks pre-operatively  Identify appropriate sources of fluids and proteins   State protein recommendations and appropriate sources pre and post-operatively  Identify Post-Operative Dietary Goals and will follow for 2 weeks post-operatively  Identify appropriate multivitamin and calcium sources  Describe the need for physical activity post-operatively and will follow MD recommendations  State when to call healthcare provider regarding medication questions or post-operative complications  Handouts given during class include:  Pre-Op Bariatric Surgery Diet Handout  Protein Shake Handout  Post-Op Bariatric Surgery Nutrition Handout  [facility] Program Information Flyer  Support Group Information Flyer  [facility] Bariatric Supplements Price List  Follow-Up Plan: Patient will follow-up at [facility] 2 weeks post operatively for diet advancement per MD.

## 2019-08-30 ENCOUNTER — Telehealth: Payer: Self-pay | Admitting: Family Medicine

## 2019-08-30 ENCOUNTER — Ambulatory Visit
Admission: RE | Admit: 2019-08-30 | Discharge: 2019-08-30 | Disposition: A | Discharge status: Home / Self Care | Payer: BC Managed Care – PPO | Source: Ambulatory Visit | Attending: Family Medicine | Admitting: Family Medicine

## 2019-08-30 ENCOUNTER — Encounter: Payer: Self-pay | Admitting: Family Medicine

## 2019-08-30 ENCOUNTER — Other Ambulatory Visit: Payer: Self-pay

## 2019-08-30 ENCOUNTER — Ambulatory Visit: Payer: BC Managed Care – PPO | Admitting: Family Medicine

## 2019-08-30 VITALS — BP 142/80 | HR 82 | Temp 98.5°F | Resp 14 | Ht 67.5 in | Wt 353.0 lb

## 2019-08-30 DIAGNOSIS — R202 Paresthesia of skin: Secondary | ICD-10-CM | POA: Diagnosis not present

## 2019-08-30 DIAGNOSIS — M79601 Pain in right arm: Secondary | ICD-10-CM | POA: Diagnosis not present

## 2019-08-30 DIAGNOSIS — M546 Pain in thoracic spine: Secondary | ICD-10-CM

## 2019-08-30 DIAGNOSIS — M79602 Pain in left arm: Secondary | ICD-10-CM

## 2019-08-30 MED ORDER — CYCLOBENZAPRINE HCL 10 MG PO TABS: 10.0000 mg | ORAL_TABLET | Freq: Three times a day (TID) | ORAL | 0 refills | Status: AC | PRN

## 2019-08-30 MED ORDER — PREDNISONE 10 MG PO TABS
ORAL_TABLET | ORAL | 0 refills | Status: DC
Start: 2019-08-30 — End: 2019-11-25

## 2019-08-30 MED ORDER — METHYLPREDNISOLONE ACETATE 40 MG/ML IJ SUSP
40.0000 mg | Freq: Once | INTRAMUSCULAR | Status: AC
  Administered 2019-08-30: 40 mg via INTRAMUSCULAR

## 2019-08-30 MED ORDER — HYDROCODONE-ACETAMINOPHEN 5-325 MG PO TABS: 1.0000 | ORAL_TABLET | Freq: Four times a day (QID) | ORAL | 0 refills | Status: DC | PRN

## 2019-08-30 NOTE — Patient Instructions (Signed)
Get xray done [facility]  [address] [address]  Take Prednisone as prescribed Take flexeril for spasm  Hydrocodone for pain

## 2019-08-30 NOTE — Progress Notes (Signed)
Subjective:    Patient ID: Carla [name], female    DOB: [birth year], 39 y.o.   MRN: [id number]  Patient presents for Back Pain (x1 week- worsened in the last 3 days- decreased ROM to B arms and neck- numbness to R hand- did have a long drive over weekend (36+ hours))  Patient here with mid back pain that is mostly on the right side for the past week.  She denies any particular injury to her back.  But she now has pain radiating to her shoulders and her arm she has some numbness and tingling in her right hand she does have mild symptoms in the left.  She drove for about 10 hours over the holiday weekend typically does not drive this long. She denies any low back pain or any symptoms in her lower extremities.  She has tried heating pad she tried ibuprofen tried baclofen with minimal improvement. She has mild neck symptoms but mostly between the shoulder blades  SHE ALSO remembers carrying all the groceries in the house at one time a few days ago  Review Of Systems:  GEN- denies fatigue, fever, weight loss,weakness, recent illness HEENT- denies eye drainage, change in vision, nasal discharge, CVS- denies chest pain, palpitations RESP- denies SOB, cough, wheeze ABD- denies N/V, change in stools, abd pain GU- denies dysuria, hematuria, dribbling, incontinence MSK- + joint pain,+ muscle aches, injury Neuro- denies headache, dizziness, syncope, seizure activity       Objective:    BP (!) 142/80   Pulse 82   Temp 98.5 F (36.9 C) (Temporal)   Resp 14   Ht 5' 7.5" (1.715 m)   Wt (!) 353 lb (160.1 kg)   LMP 08/15/2019 (Exact Date)   SpO2 97%   BMI 54.47 kg/m  GEN- NAD, alert and oriented x3, antalgic gait  HEENT- PERRL, EOMI, non injected sclera, pink conjunctiva, MMM, oropharynx clear Neck- Supple,  CVS- RRR, no murmur RESP-CTAB NEURO-CNII-XII in tact, DTR intact UE, sensation grossly in tact UE, monofilament, mild decrease right finger tips  neg spurlings  MSK- decreased ROM neck,  thorcaic spine, rotator cuff in tact  TTP thoracic spine and right post shoulder blade  + spasm  Lumbar spine NT, neg SLR  EXT- No edema Pulses- Radial, 2+        Assessment & Plan:      Problem List Items Addressed This Visit    None    Visit Diagnoses    Acute right-sided thoracic back pain    -  Primary   Based on symptoms pt with pinched nerve causing neuropathic pain in arm, Xray of thoracic spine, neg for acute abnormality, she has some known DDD in c Spine.  We will treat her with Depo-Medrol injection and prednisone taper.  Flexeril for muscle spasm and hydrocodone for pain.  If she is not improving by next week with symptoms of tingling numbness into the hand I think this warrants an MRI look for nerve compression   Relevant Medications   predniSONE (DELTASONE) 10 MG tablet   cyclobenzaprine (FLEXERIL) 10 MG tablet   HYDROcodone-acetaminophen (NORCO) 5-325 MG tablet   methylPREDNISolone acetate (DEPO-MEDROL) injection 40 mg (Completed)   Other Relevant Orders   DG Thoracic Spine W/Swimmers (Completed)   Paresthesia and pain of both upper extremities       Relevant Medications   methylPREDNISolone acetate (DEPO-MEDROL) injection 40 mg (Completed)   Other Relevant Orders   DG Thoracic Spine W/Swimmers (Completed)  Note: This dictation was prepared with Dragon dictation along with smaller phrase technology. Any transcriptional errors that result from this process are unintentional.

## 2019-08-30 NOTE — Telephone Encounter (Signed)
CB# [phone] Back pain would like to be seen today can get medication for pain

## 2019-08-30 NOTE — Telephone Encounter (Signed)
Appointment scheduled.

## 2019-11-25 ENCOUNTER — Other Ambulatory Visit: Payer: Self-pay

## 2019-11-25 ENCOUNTER — Ambulatory Visit (INDEPENDENT_AMBULATORY_CARE_PROVIDER_SITE_OTHER): Payer: 59 | Admitting: Nurse Practitioner

## 2019-11-25 DIAGNOSIS — J309 Allergic rhinitis, unspecified: Secondary | ICD-10-CM

## 2019-11-25 DIAGNOSIS — J45901 Unspecified asthma with (acute) exacerbation: Secondary | ICD-10-CM

## 2019-11-25 DIAGNOSIS — J40 Bronchitis, not specified as acute or chronic: Secondary | ICD-10-CM

## 2019-11-25 MED ORDER — PSEUDOEPH-BROMPHEN-DM 30-2-10 MG/5ML PO SYRP: 5.0000 mL | ORAL_SOLUTION | Freq: Four times a day (QID) | ORAL | 0 refills | Status: AC | PRN

## 2019-11-25 MED ORDER — PREDNISONE 10 MG PO TABS: ORAL_TABLET | ORAL | 0 refills | Status: DC

## 2019-11-25 MED ORDER — FLUTICASONE PROPIONATE 50 MCG/ACT NA SUSP: 1.0000 | Freq: Every day | NASAL | 6 refills | Status: AC

## 2019-11-25 NOTE — Progress Notes (Signed)
Virtual Visit via Telephone Note  I connected with Carla [name] on 11/27/19 at  4:00 PM EDT by telephone and verified that I am speaking with the correct person using two identifiers.   I discussed the limitations, risks, security and privacy concerns of performing an evaluation and management service by telephone and the availability of in person appointments. I also discussed with the patient that there may be a patient responsible charge related to this service. The patient expressed understanding and agreed to proceed.   History of Present Illness: Pt at her home and provider in clinic at [facility]. Pt is a 39 year old female presenting for a telephone visit related to sxs of nasal congestion and cough. Her sxs started 2 days ago. She has contributing h/o asthma. She has used her usual asthma medications but not relieving her sxs. She is Covid vaccinated. She has not been exposed to [place], has no sick contacts, no contacts with similar sxs. She has had similar sxs prior with seasonal allergy/asthma sxs and used brompheniramine-pseudoephedrine syrup which relieved her nasal congestion and cough. She reports having a small amount left from last year taking this AM which relieved her sxs and would like a refill. She does feel that her asthma is exacerbated with the coughing spells which she feels causes bronchospasms with increased mucous production. Her albuterol inhaler does help relieve this sxs and she does not need a refill at this time.   No cp, ct, gu/gi sxs, no loss of smell or taste, h/a, general body aches, fever, chills.    Observations/Objective: A/ox3, able to speak full sentences, no harsh cough, does not sound short of breath, no wheezing or strider heard  Past Medical History:  Diagnosis Date  . Allergy   . Asthma   . Fibroids   . Obesity   . Pregnancy induced hypertension    Past Surgical History:  Procedure Laterality Date  . CERVICAL CERCLAGE  2003,  2008  . CERVICAL CERCLAGE N/A 04/12/2016   Procedure: CERCLAGE CERVICAL;  Surgeon: Bobbye Charleston, MD;  Location: [facility];  Service: Gynecology;  Laterality: N/A;  . CERVICAL CERCLAGE N/A 09/23/2016   Procedure: removal CERCLAGE CERVICAL;  Surgeon: Sanjuana Kava, MD;  Location: [place];  Service: Obstetrics;  Laterality: N/A;  . CESAREAN SECTION N/A 09/23/2016   Procedure: CESAREAN SECTION;  Surgeon: Sanjuana Kava, MD;  Location: [place];  Service: Obstetrics;  Laterality: N/A;  RNFA  Tracey T  . DILATION AND CURETTAGE OF UTERUS    . ectopic    . MYOMECTOMY  2016   fibroid removal   Current Outpatient Medications on File Prior to Visit  Medication Sig Dispense Refill  . albuterol (VENTOLIN HFA) 108 (90 Base) MCG/ACT inhaler Inhale 2 puffs into the lungs every 4 (four) hours as needed for wheezing or shortness of breath (before exercise). 18 g 2  . cetirizine (ZYRTEC) 10 MG tablet Take 1 tablet (10 mg total) by mouth daily as needed for allergies. 30 tablet 11  . cyclobenzaprine (FLEXERIL) 10 MG tablet Take 1 tablet (10 mg total) by mouth 3 (three) times daily as needed for muscle spasms. 30 tablet 0  . HYDROcodone-acetaminophen (NORCO) 5-325 MG tablet Take 1 tablet by mouth every 6 (six) hours as needed for moderate pain. 15 tablet 0  . JUNEL FE 1.5/30 1.5-30 MG-MCG tablet Take 1 tablet by mouth daily.    . Multiple Vitamin (MULTIVITAMIN WITH MINERALS) TABS tablet Take 1 tablet by mouth  daily.     No current facility-administered medications on file prior to visit.   Allergies  Allergen Reactions  . Penicillins Hives and Nausea And Vomiting    Has patient had a PCN reaction causing immediate rash, facial/tongue/throat swelling, SOB or lightheadedness with hypotension: No Has patient had a PCN reaction causing severe rash involving mucus membranes or skin necrosis: Yes Has patient had a PCN reaction that required hospitalization No Has patient had a PCN reaction occurring  within the last 10 years: No If all of the above answers are "NO", then may proceed with Cephalosporin use.   . Ibuprofen Palpitations  . Latex Rash   Immunization History  Administered Date(s) Administered  . PFIZER SARS-COV-2 Vaccination 06/15/2019, 07/13/2019  . Tdap 07/27/2016     Assessment and Plan: Exacerbation of asthma, unspecified asthma severity, unspecified whether persistent - Plan: predniSONE (DELTASONE) 10 MG tablet  Bronchitis - Plan: brompheniramine-pseudoephedrine-DM 30-2-10 MG/5ML syrup  Allergic rhinitis, unspecified seasonality, unspecified trigger - Plan: fluticasone (FLONASE) 50 MCG/ACT nasal spray  Get plenty of rest Drink plenty of water If sxs persist, new sxs, or worsening sxs consider COVID testing Continue using asthma inhaler and medications as prescribed  Follow Up Instructions:    I discussed the assessment and treatment plan with the patient. The patient was provided an opportunity to ask questions and all were answered. The patient agreed with the plan and demonstrated an understanding of the instructions.   The patient was advised to call back or seek an in-person evaluation if the symptoms worsen or if the condition fails to improve as anticipated.  I provided 15 minutes of non-face-to-face time during this encounter. 4:15PM  Annie Main, FNP

## 2019-12-31 ENCOUNTER — Other Ambulatory Visit: Payer: Self-pay

## 2019-12-31 ENCOUNTER — Encounter: Payer: Self-pay | Admitting: Family Medicine

## 2019-12-31 ENCOUNTER — Ambulatory Visit (INDEPENDENT_AMBULATORY_CARE_PROVIDER_SITE_OTHER): Payer: 59 | Admitting: Family Medicine

## 2019-12-31 VITALS — BP 138/82 | HR 78 | Temp 97.9°F | Resp 14 | Ht 67.5 in | Wt 362.0 lb

## 2019-12-31 DIAGNOSIS — R030 Elevated blood-pressure reading, without diagnosis of hypertension: Secondary | ICD-10-CM | POA: Diagnosis not present

## 2019-12-31 DIAGNOSIS — R42 Dizziness and giddiness: Secondary | ICD-10-CM

## 2019-12-31 NOTE — Patient Instructions (Addendum)
At home stay below 140/90 Middle of day add protein shake, water, fruit/veggies  Continue vitamins  Send me blood pressure readings in 2 weeks

## 2019-12-31 NOTE — Progress Notes (Signed)
   Subjective:    Patient ID: Carla [name], female    DOB: 05-17-1980, 39 y.o.   MRN: [id number]  Patient presents for Dizziness (reports that she has slight dizziness upon standing- has not been checking her BP regularly)  Pt here with occ mild dizzy spells, no syncope for months  she was checking her BP at home it was very high, but thinks her cuff is too smal She has not been drinking as much fluid as she needs  She doesn't typically eat until early afternoon 1-2 pm and often doesn't drink anythign until later  she is up on her feet sometimes feels a woozy feeling  She had to change to the [facility] for her weight loss surgery, she was told similar things about diet from nutritionist with her symptoms   She had labs done with the bariatric center with [facility], told her WBC/Hb was a little low   August 2nd labs   CBC 5.6/38.7/56/433  Metabolic Na 295  K 4.4  BUN 15  Cr 0.77  She is still eating out for dinner most days, weight up 9lbs since her visit inJune   Review Of Systems:  GEN- denies fatigue, fever, weight loss,weakness, recent illness HEENT- denies eye drainage, change in vision, nasal discharge, CVS- denies chest pain, palpitations RESP- denies SOB, cough, wheeze ABD- denies N/V, change in stools, abd pain GU- denies dysuria, hematuria, dribbling, incontinence MSK- denies joint pain, muscle aches, injury Neuro- denies headache, +dizziness, syncope, seizure activity       Objective:    BP 138/82   Pulse 78   Temp 97.9 F (36.6 C) (Temporal)   Resp 14   Ht 5' 7.5" (1.715 m)   Wt (!) 362 lb (164.2 kg)   SpO2 98%   BMI 55.86 kg/m  GEN- NAD, alert and oriented x3 HEENT- PERRL, EOMI, non injected sclera, pink conjunctiva, MMM, oropharynx clear Neck- Supple, no thyromegaly CVS- RRR, no murmur RESP-CTAB ABD-NABS,soft,NT,ND NEURO-CNII-XII in tact, no focal deficits noted  EXT- No edema Pulses- Radial, DP- 2+  RepeaT bp rIGHT ARM - Thigh cuff 140/ 90 ,  Left 136/88        Assessment & Plan:      Problem List Items Addressed This Visit    None    Visit Diagnoses    Dizzy spells    -  Primary   Intermittant dizzy spells, likley MTF, bp is mildly elevated, but she is not drnking or eating for most of the day, which can cause dehydrated state and hypoglycemia symptoms Recommend she had easy protein mid morning such as a shake, with a veggie/ fruit she can easily transport, increase water decreaed processed foods, salt in food which will also help BP She is going to get a larger cuff size and monitor BP at home Send me readings in 2 weeks, before any bp meds are started Recent labs look good    Elevated blood pressure reading          Note: This dictation was prepared with Dragon dictation along with smaller phrase technology. Any transcriptional errors that result from this process are unintentional.

## 2020-01-01 ENCOUNTER — Encounter: Payer: Self-pay | Admitting: Family Medicine

## 2020-05-07 ENCOUNTER — Other Ambulatory Visit: Payer: Self-pay

## 2020-05-07 ENCOUNTER — Telehealth (INDEPENDENT_AMBULATORY_CARE_PROVIDER_SITE_OTHER): Payer: 59 | Admitting: Nurse Practitioner

## 2020-05-07 DIAGNOSIS — B351 Tinea unguium: Secondary | ICD-10-CM | POA: Diagnosis not present

## 2020-05-07 MED ORDER — EFINACONAZOLE 10 % EX SOLN: 1.0000 | Freq: Every day | CUTANEOUS | 3 refills | Status: AC

## 2020-05-07 NOTE — Progress Notes (Signed)
Subjective:    Patient ID: Carla [name], female    DOB: [birth year], 40 y.o.   MRN: [id number]  HPI: Carla [name] is a 40 y.o. female presenting virtually for right great toe pain.  Chief Complaint  Patient presents with  . Pain    Pain for a while and discoloration in the right great toe, toe is yellowing looks like nail fungus and peeling skin. Using otc, no discharge from the toenail.   TOE PAIN Yellow/opaque color - has been using for some time now; the other nails got better beside; nail has gotten worse and pain has started.  Does get pedicures regularly; has not gone to the salon. Duration: months Involved toe: rightbig toe   Mechanism of injury: unknown Onset: sudden Severity: mild  Quality:  Discoloration, peeling skin Frequency: intermittent, only when wearing tight socks/shoes Radiation: no Aggravating factors: wearing shoe/sock  Alleviating factors: takes shoes off and wears slides at home  Status: worse Treatments attempted: OTC nail fungus ointment Relief with NSAIDs?: No NSAIDs Taken Morning stiffness: no Redness: yes ; around the nail on the right side Bruising: no Swelling: yes  Paresthesias / decreased sensation: no Fevers: no  Discharge: no Odor: no  Allergies  Allergen Reactions  . Penicillins Hives and Nausea And Vomiting    Has patient had a PCN reaction causing immediate rash, facial/tongue/throat swelling, SOB or lightheadedness with hypotension: No Has patient had a PCN reaction causing severe rash involving mucus membranes or skin necrosis: Yes Has patient had a PCN reaction that required hospitalization No Has patient had a PCN reaction occurring within the last 10 years: No If all of the above answers are "NO", then may proceed with Cephalosporin use.   . Ibuprofen Palpitations  . Latex Rash    Outpatient Encounter Medications as of 05/07/2020  Medication Sig  . albuterol (VENTOLIN HFA) 108 (90 Base) MCG/ACT inhaler Inhale 2 puffs into  the lungs every 4 (four) hours as needed for wheezing or shortness of breath (before exercise).  . cetirizine (ZYRTEC) 10 MG tablet Take 1 tablet (10 mg total) by mouth daily as needed for allergies.  . cyclobenzaprine (FLEXERIL) 10 MG tablet Take 1 tablet (10 mg total) by mouth 3 (three) times daily as needed for muscle spasms.  . Efinaconazole 10 % SOLN Apply 1 Dose topically daily. Apply 1 dose daily to affected toenail for 48 weeks.  . fluticasone (FLONASE) 50 MCG/ACT nasal spray Place 1 spray into both nostrils daily.  . Multiple Vitamin (MULTIVITAMIN WITH MINERALS) TABS tablet Take 1 tablet by mouth daily.   No facility-administered encounter medications on file as of 05/07/2020.    Patient Active Problem List   Diagnosis Date Noted  . Hyperlipidemia 12/25/2018  . Pregnancy w/ hx of uterine myomectomy 08/26/2016  . Obesity 06/02/2015  . Environmental allergies 06/02/2015  . Asthma     Past Medical History:  Diagnosis Date  . Allergy   . Asthma   . Fibroids   . Obesity   . Pregnancy induced hypertension     Relevant past medical, surgical, family and social history reviewed and updated as indicated. Interim medical history since our last visit reviewed.  Review of Systems Per HPI unless specifically indicated above     Objective:    There were no vitals taken for this visit.  Wt Readings from Last 3 Encounters:  12/31/19 (!) 362 lb (164.2 kg)  08/30/19 (!) 353 lb (160.1 kg)  07/30/19 (!) 355 lb (161 kg)  Physical Exam Nursing note reviewed.  Constitutional:      General: She is not in acute distress.    Appearance: Normal appearance. She is not toxic-appearing.  HENT:     Head: Normocephalic and atraumatic.  Eyes:     General: No scleral icterus.    Extraocular Movements: Extraocular movements intact.  Pulmonary:     Effort: Pulmonary effort is normal. No respiratory distress.  Skin:    Coloration: Skin is not jaundiced or pale.     Findings: No erythema.      Comments: Right great toe nail discolored yellow/white about midway to lunula.  No erythema/exudate noted.  Neurological:     Mental Status: She is alert and oriented to person, place, and time.  Psychiatric:        Mood and Affect: Mood normal.        Behavior: Behavior normal.        Thought Content: Thought content normal.        Judgment: Judgment normal.       Assessment & Plan:  1. Onychomycosis Chronic, ongoing.  Discussed options for treatment including oral antifungal (requiring blood work and monitoring of liver enzymes) vs. Topical.  Patient elects to proceed with topical treatment for now.  Discouraged use of pedicures and cosmetic products until completely resolved.  Will start efinaconazole 10% daily for 48 weeks.  Return to clinic if no better in ~3 months, may consider oral antifungal.  Follow up plan: Return if symptoms worsen or fail to improve.  Due to the catastrophic nature of the COVID-19 pandemic, this video visit was completed soley via audio and visual contact via Caregility due to the restrictions of the COVID-19 pandemic. All issues as above were discussed and addressed. Physical exam was done as above through visual confirmation on Caregility. If it was felt that the patient should be evaluated in the office, they were directed there. The patient verbally consented to this visit. . Location of the patient: work . Location of the provider: work . Those involved with this call:  . Provider: Noemi Chapel, DNP, FNP-C . CMA: Annabelle Harman, CMA . Front Desk/Registration: Santina Evans  . Time spent on call: 12 minutes with patient face to face via video conference. More than 50% of this time was spent in counseling and coordination of care. 20 minutes total spent in review of patient's record and preparation of their chart.  I verified patient identity using two factors (patient name and date of birth). Patient consents verbally to being seen via telemedicine  visit today.

## 2020-06-11 DIAGNOSIS — H93291 Other abnormal auditory perceptions, right ear: Secondary | ICD-10-CM | POA: Insufficient documentation

## 2020-06-11 DIAGNOSIS — M2669 Other specified disorders of temporomandibular joint: Secondary | ICD-10-CM | POA: Insufficient documentation

## 2021-01-04 IMAGING — CR DG THORACIC SPINE 3V
4 series · 4 of 4 positions shown · non-contrast
Comparison: None.

CLINICAL DATA: Dorsalgia

EXAM:
THORACIC SPINE - 3 VIEWS

[t t-spine a.p.]
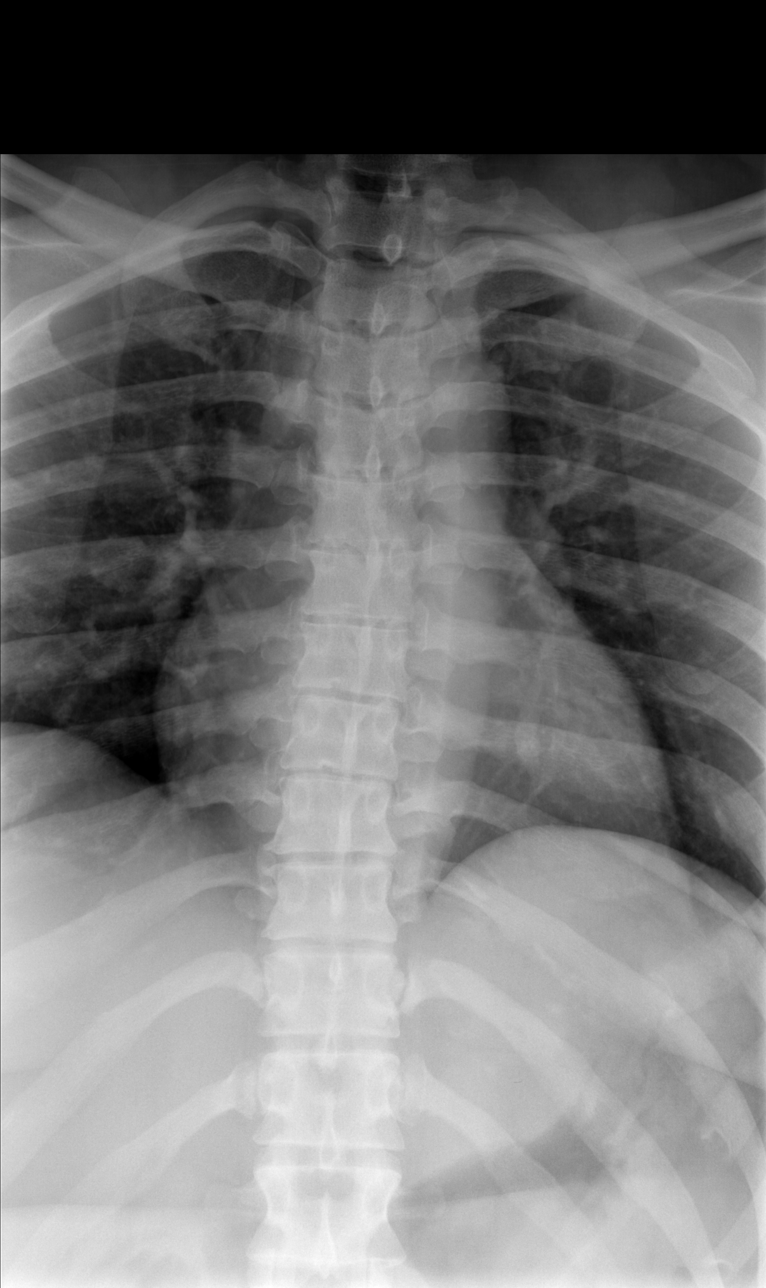

[t t-spine lat * (1 of 2)]
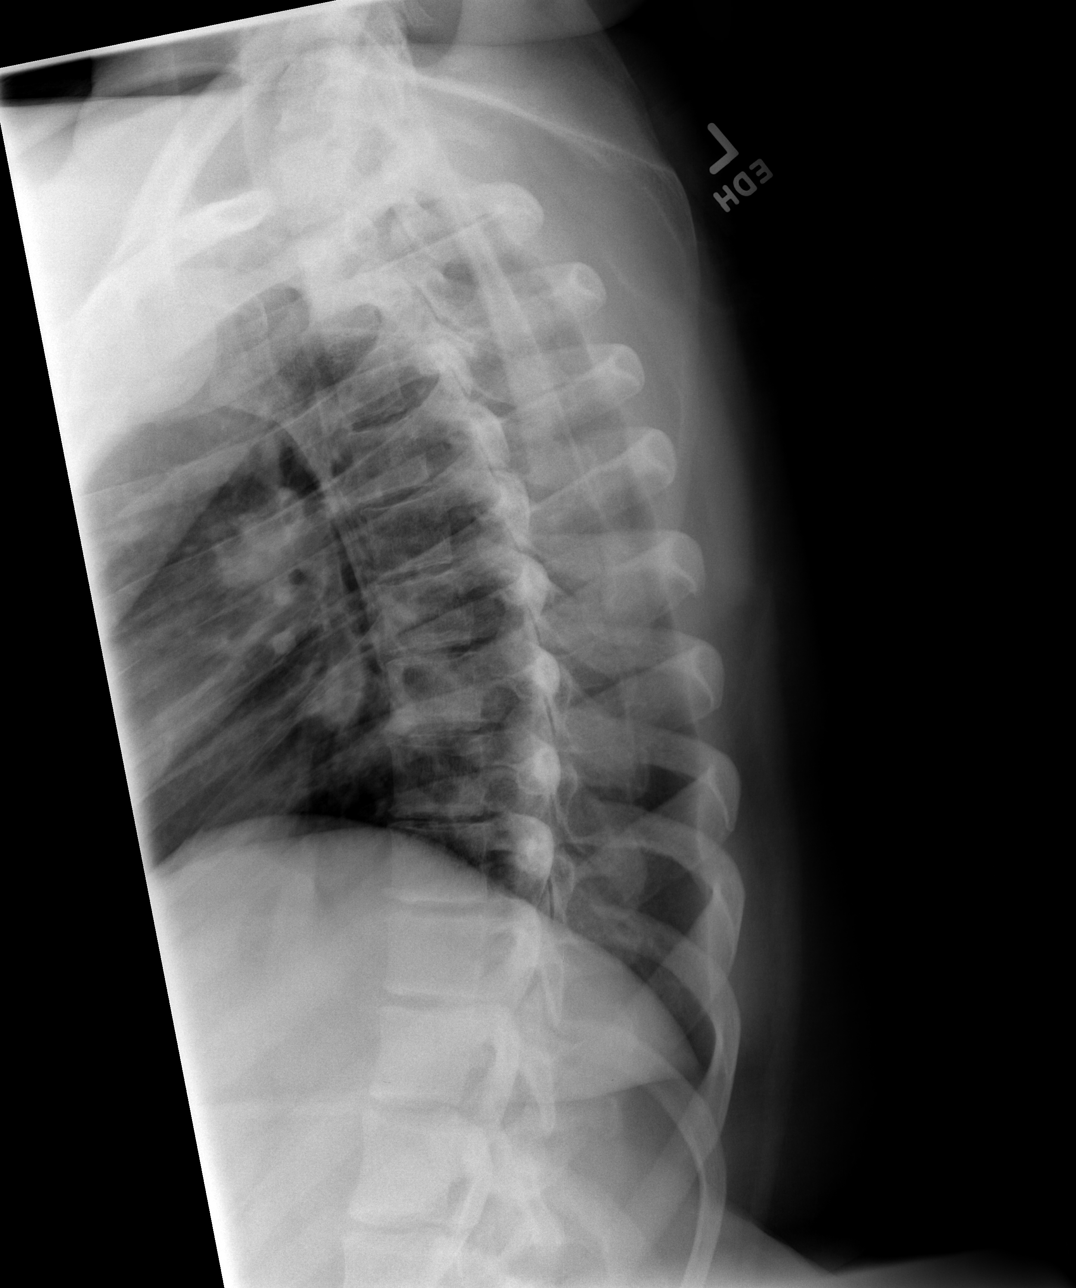

[t t-spine lat * (2 of 2)]
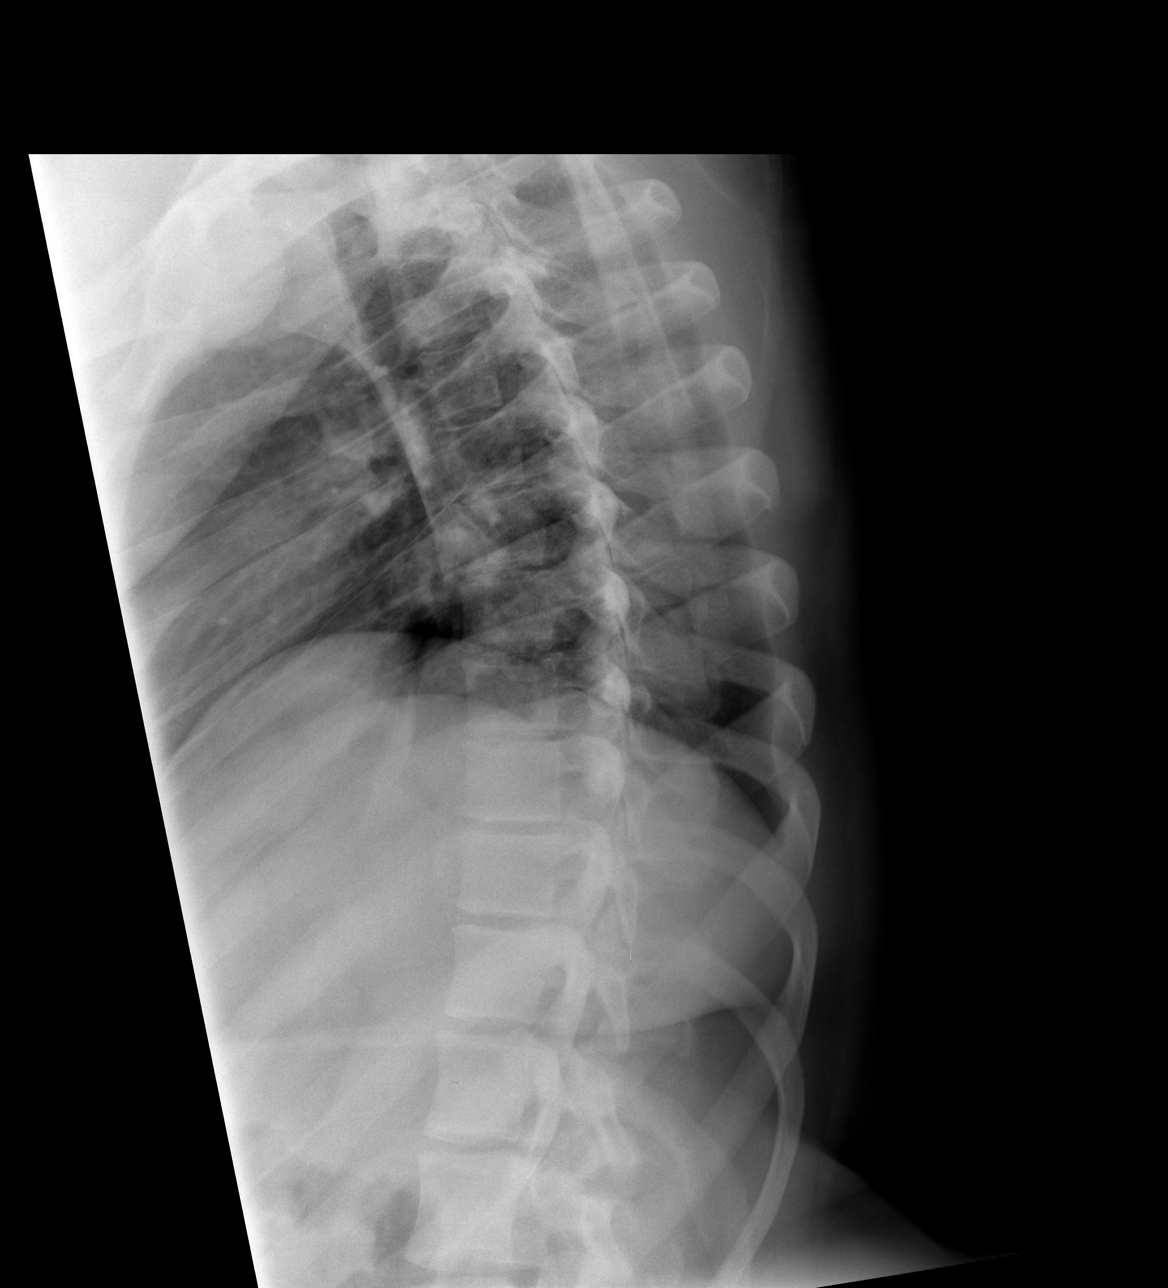

[t swimmers]
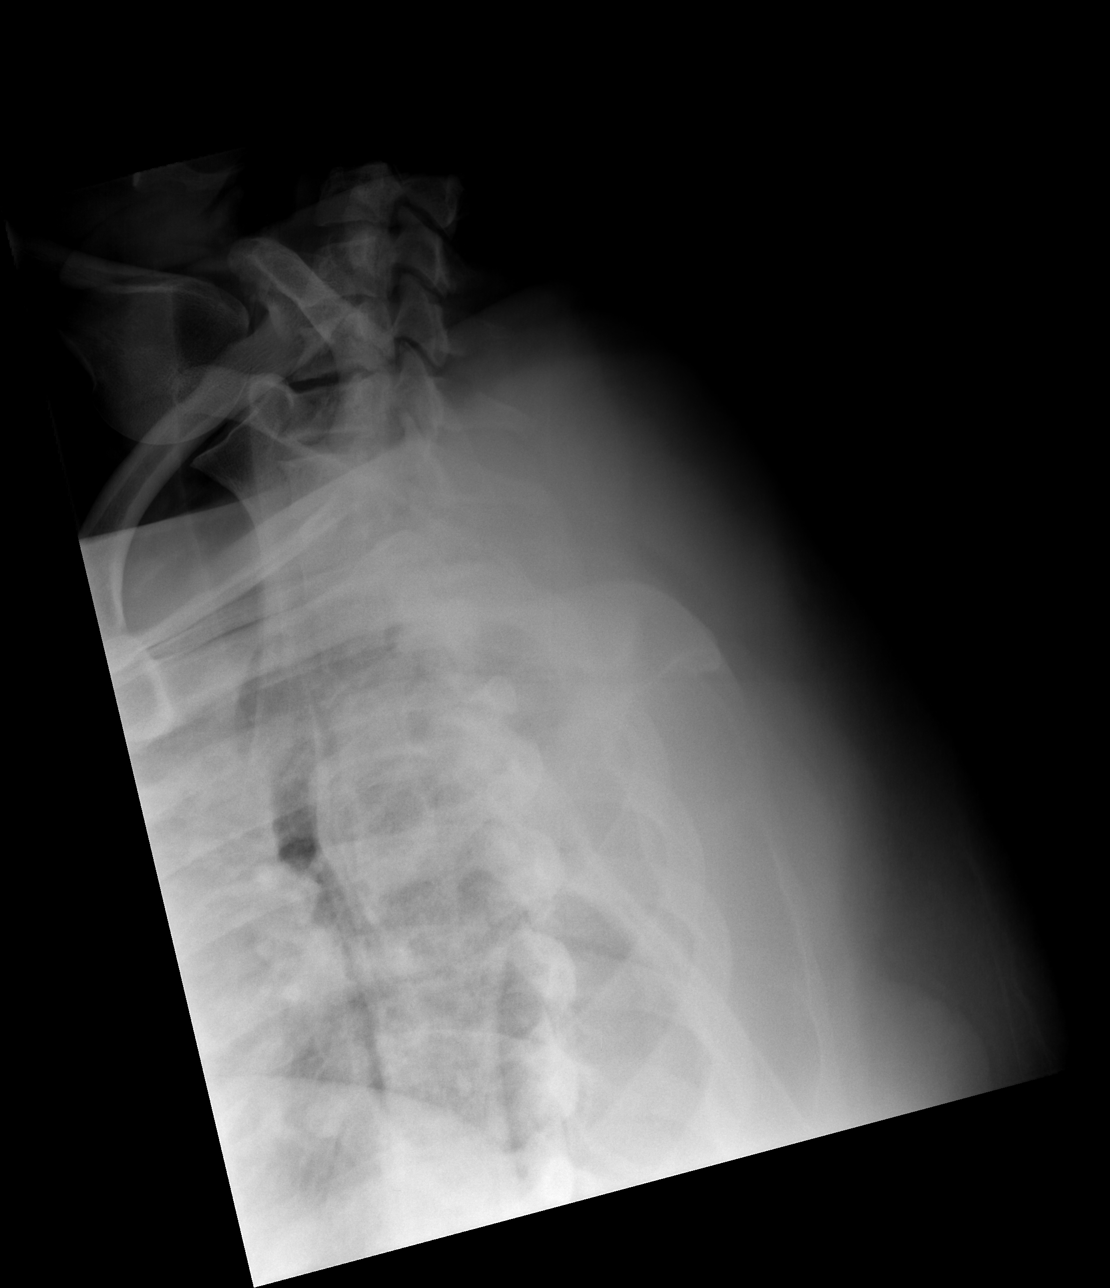

[4 of 4 positions shown; findings below may reference images not displayed]

FINDINGS: Frontal, lateral, and swimmer's views were obtained. There is mild
midthoracic levoscoliosis. No fracture or spondylolisthesis. The
disc spaces appear normal. No erosive change or paraspinous lesion.
Visualized lungs clear.
IMPRESSION: Slight scoliosis. No fracture or spondylolisthesis. No appreciable
arthropathy.

## 2021-04-28 ENCOUNTER — Ambulatory Visit (INDEPENDENT_AMBULATORY_CARE_PROVIDER_SITE_OTHER): Payer: 59 | Admitting: Podiatry

## 2021-04-28 ENCOUNTER — Encounter: Payer: Self-pay | Admitting: Podiatry

## 2021-04-28 ENCOUNTER — Other Ambulatory Visit: Payer: Self-pay

## 2021-04-28 DIAGNOSIS — Z79899 Other long term (current) drug therapy: Secondary | ICD-10-CM | POA: Diagnosis not present

## 2021-04-28 DIAGNOSIS — B351 Tinea unguium: Secondary | ICD-10-CM

## 2021-04-28 LAB — HEPATIC FUNCTION PANEL
AG Ratio: 1.7 (calc) (ref 1.0–2.5)
ALT: 19 U/L (ref 6–29)
AST: 15 U/L (ref 10–30)
Albumin: 3.8 g/dL (ref 3.6–5.1)
Alkaline phosphatase (APISO): 72 U/L (ref 31–125)
Bilirubin, Direct: 0 mg/dL (ref 0.0–0.2)
Globulin: 2.2 g/dL (calc) (ref 1.9–3.7)
Indirect Bilirubin: 0.2 mg/dL (calc) (ref 0.2–1.2)
Total Bilirubin: 0.2 mg/dL (ref 0.2–1.2)
Total Protein: 6 g/dL — ABNORMAL LOW (ref 6.1–8.1)

## 2021-04-28 NOTE — Progress Notes (Addendum)
Subjective:  Patient ID: Carla [name], female    DOB: 02-06-1981,  MRN: [id number]  Chief Complaint  Patient presents with   Nail Problem    Nail fungus     41 y.o. female presents with the above complaint.  Patient presents with complaint of bilateral hallux nail fungus.  She states has been present for quite some time is progressive gotten worse.  She has tried some over-the-counter medication which has helped.  She has not taken any oral medication she has not seen anyone else prior to seeing me.  She denies any other acute complaints.  She has not pregnant or planning on breast-feeding.  There is no pain associated with it very mild in nature.  Primarily the discoloration   Review of Systems: Negative except as noted in the HPI. Denies N/V/F/Ch.  Past Medical History:  Diagnosis Date   Allergy    Asthma    Fibroids    Obesity    Pregnancy induced hypertension     Current Outpatient Medications:    albuterol (VENTOLIN HFA) 108 (90 Base) MCG/ACT inhaler, Inhale 2 puffs into the lungs every 4 (four) hours as needed for wheezing or shortness of breath (before exercise)., Disp: 18 g, Rfl: 2   azithromycin (ZITHROMAX) 250 MG tablet, TAKE 2 TABLETS BY MOUTH TODAY, THEN TAKE 1 TABLET DAILY FOR 4 DAYS, Disp: , Rfl:    benzonatate (TESSALON) 200 MG capsule, SMARTSIG:1 Capsule(s) By Mouth 2-3 Times Daily PRN, Disp: , Rfl:    brompheniramine-pseudoephedrine-DM 30-2-10 MG/5ML syrup, brompheniramine-pseudoephedrine-DM 2 mg-30 mg-10 mg/5 mL oral syrup, Disp: , Rfl:    cetirizine (ZYRTEC) 10 MG tablet, Take 1 tablet (10 mg total) by mouth daily as needed for allergies., Disp: 30 tablet, Rfl: 11   cyclobenzaprine (FLEXERIL) 10 MG tablet, Take 1 tablet (10 mg total) by mouth 3 (three) times daily as needed for muscle spasms., Disp: 30 tablet, Rfl: 0   Docusate Sodium (DSS) 100 MG CAPS, Take by mouth., Disp: , Rfl:    fluticasone (FLONASE) 50 MCG/ACT nasal spray, Place 1 spray into both nostrils  daily., Disp: 16 g, Rfl: 6   HYDROcodone-acetaminophen (NORCO/VICODIN) 5-325 MG tablet, hydrocodone 5 mg-acetaminophen 325 mg tablet, Disp: , Rfl:    metroNIDAZOLE (FLAGYL) 500 MG tablet, metronidazole 500 mg tablet, Disp: , Rfl:    Multiple Vitamin (MULTIVITAMIN WITH MINERALS) TABS tablet, Take 1 tablet by mouth daily., Disp: , Rfl:    PAXLOVID, 300/100, 20 x 150 MG & 10 x 100MG  TBPK, Take 3 tablets by mouth 2 (two) times daily., Disp: , Rfl:    Pediatric Multiple Vitamins (FLINTSTONES PLUS EXTRA C) CHEW, Chew by mouth., Disp: , Rfl:    predniSONE (DELTASONE) 10 MG tablet, prednisone 10 mg tablet, Disp: , Rfl:    promethazine-dextromethorphan (PROMETHAZINE-DM) 6.25-15 MG/5ML syrup, Take 5 mLs by mouth every 6 (six) hours as needed., Disp: , Rfl:    tiZANidine (ZANAFLEX) 4 MG tablet, tizanidine 4 mg tablet  TAKE 1 TABLET BY MOUTH EVERY 6 HOURS AS NEEDED FOR MUSCLE SPASM, Disp: , Rfl:    triamcinolone cream (KENALOG) 0.1 %, Apply 1 application topically 3 (three) times daily., Disp: , Rfl:   Social History   Tobacco Use  Smoking Status Never  Smokeless Tobacco Never    Allergies  Allergen Reactions   Penicillins Hives and Nausea And Vomiting    Has patient had a PCN reaction causing immediate rash, facial/tongue/throat swelling, SOB or lightheadedness with hypotension: No Has patient had a PCN reaction causing  severe rash involving mucus membranes or skin necrosis: Yes Has patient had a PCN reaction that required hospitalization No Has patient had a PCN reaction occurring within the last 10 years: No If all of the above answers are "NO", then may proceed with Cephalosporin use.    Ibuprofen Palpitations   Latex Rash   Objective:  There were no vitals filed for this visit. There is no height or weight on file to calculate BMI. Constitutional Well developed. Well nourished.  Vascular Dorsalis pedis pulses palpable bilaterally. Posterior tibial pulses palpable bilaterally. Capillary  refill normal to all digits.  No cyanosis or clubbing noted. Pedal hair growth normal.  Neurologic Normal speech. Oriented to person, place, and time. Epicritic sensation to light touch grossly present bilaterally.  Dermatologic Nails thickened elongated dystrophic mycotic discolored nails noted x2 bilateral hallux.  Mild pain on palpation Skin within normal limits  Orthopedic: Normal joint ROM without pain or crepitus bilaterally. No visible deformities. No bony tenderness.   Radiographs: None Assessment:   1. Long-term use of high-risk medication   2. Nail fungus   3. Onychomycosis due to dermatophyte    Plan:  Patient was evaluated and treated and all questions answered.  Bilateral hallux onychomycosis Educated the patient on the etiology of onychomycosis and various treatment options associated with improving the fungal load.  I explained to the patient that there is 3 treatment options available to treat the onychomycosis including topical, p.o., laser treatment.  Patient elected to undergo p.o. options with Lamisil/terbinafine therapy.  In order for me to start the medication therapy, I explained to the patient the importance of evaluating the liver and obtaining the liver function test.  Once the liver function test comes back normal I will start him on 48-month course of Lamisil therapy.  Patient understood all risk and would like to proceed with Lamisil therapy.  I have asked the patient to immediately stop the Lamisil therapy if she has any reactions to it and call the office or go to the emergency room right away.  Patient states understanding -She will also do laser at the same time    No follow-ups on file.

## 2021-04-29 MED ORDER — TERBINAFINE HCL 250 MG PO TABS: 250.0000 mg | ORAL_TABLET | Freq: Every day | ORAL | 0 refills | Status: DC

## 2021-04-29 NOTE — Addendum Note (Signed)
Addended by: Boneta Lucks on: 04/29/2021 09:52 AM   Modules accepted: Orders

## 2021-05-04 ENCOUNTER — Other Ambulatory Visit: Payer: Self-pay

## 2021-05-04 ENCOUNTER — Ambulatory Visit (INDEPENDENT_AMBULATORY_CARE_PROVIDER_SITE_OTHER): Payer: 59

## 2021-05-04 ENCOUNTER — Other Ambulatory Visit: Payer: 59

## 2021-05-04 DIAGNOSIS — B351 Tinea unguium: Secondary | ICD-10-CM

## 2021-05-04 NOTE — Progress Notes (Signed)
Patient presents today for the 1st laser treatment. Diagnosed with mycotic nail infection by Dr. Posey Pronto.   Toenail most affected bilateral hallux.  All other systems are negative.  Nails were filed thin. Laser therapy was administered to bilateral hallux  toenails  and patient tolerated the treatment well. All safety precautions were in place.   Patient is on a daily dose of Lamisil for 3 months as well.  Follow up in 6 weeks for laser # 2.  Please take picture of nails at next visit to document visual progress

## 2021-05-04 NOTE — Patient Instructions (Signed)

## 2021-06-11 ENCOUNTER — Other Ambulatory Visit: Payer: Self-pay

## 2021-06-11 ENCOUNTER — Ambulatory Visit (INDEPENDENT_AMBULATORY_CARE_PROVIDER_SITE_OTHER): Payer: 59 | Admitting: *Deleted

## 2021-06-11 DIAGNOSIS — B351 Tinea unguium: Secondary | ICD-10-CM

## 2021-06-11 NOTE — Progress Notes (Signed)
Patient presents today for the 2nd laser treatment. Diagnosed with mycotic nail infection by Dr. Posey Pronto.  ? ?Toenail most affected bilateral hallux. She is happy with the progress so far. ? ?All other systems are negative. ? ?Nails were filed thin. Laser therapy was administered to bilateral hallux  toenails  and patient tolerated the treatment well. All safety precautions were in place.  ? ?Patient has completed 30 of the 90 days of lamisil. ? ?Follow up in 6 weeks for laser # 3. ? ?Picture of nails taken today to document visual progress  ?

## 2021-07-23 ENCOUNTER — Other Ambulatory Visit: Payer: Self-pay

## 2021-07-26 ENCOUNTER — Ambulatory Visit (INDEPENDENT_AMBULATORY_CARE_PROVIDER_SITE_OTHER): Payer: Self-pay

## 2021-07-26 DIAGNOSIS — B351 Tinea unguium: Secondary | ICD-10-CM

## 2021-07-26 NOTE — Progress Notes (Signed)
Patient presents today for the 3rd laser treatment. Diagnosed with mycotic nail infection by Dr. Posey Pronto.  ? ?Toenail most affected bilateral hallux. She is happy with the progress so far. ? ?All other systems are negative. ? ?Nails were filed thin. Laser therapy was administered to bilateral hallux  toenails  and patient tolerated the treatment well. All safety precautions were in place.  ? ?Patient has completed 60 of the 90 days of lamisil. ? ?Follow up in 6 weeks for laser # 4. ? ? ?

## 2021-07-29 ENCOUNTER — Other Ambulatory Visit: Payer: Self-pay

## 2021-08-25 ENCOUNTER — Ambulatory Visit: Payer: 59 | Admitting: Podiatry

## 2021-09-08 ENCOUNTER — Ambulatory Visit (INDEPENDENT_AMBULATORY_CARE_PROVIDER_SITE_OTHER): Payer: 59 | Admitting: Podiatry

## 2021-09-08 DIAGNOSIS — B351 Tinea unguium: Secondary | ICD-10-CM | POA: Diagnosis not present

## 2021-09-08 DIAGNOSIS — Z79899 Other long term (current) drug therapy: Secondary | ICD-10-CM | POA: Diagnosis not present

## 2021-09-10 ENCOUNTER — Other Ambulatory Visit: Payer: 59

## 2021-09-10 ENCOUNTER — Other Ambulatory Visit: Payer: Self-pay

## 2021-09-10 NOTE — Progress Notes (Signed)
Subjective:  Patient ID: Carla [name], female    DOB: [birth year],  MRN: [id number]  Chief Complaint  Patient presents with   Nail Problem    Nail fungus follow up     41 y.o. female presents with the above complaint.  Patient presents for follow-up to bilateral hallux nail fungus.  She states doing a lot better.  She was only able to do 1-1/2 months of the course as she was not able to tolerate the rest.  She states that she would like to get back into it but for now she states is improving.  She denies any other acute complaints.  She has noticed improvement with Lamisil   Review of Systems: Negative except as noted in the HPI. Denies N/V/F/Ch.  Past Medical History:  Diagnosis Date   Allergy    Asthma    Fibroids    Obesity    Pregnancy induced hypertension     Current Outpatient Medications:    albuterol (VENTOLIN HFA) 108 (90 Base) MCG/ACT inhaler, Inhale 2 puffs into the lungs every 4 (four) hours as needed for wheezing or shortness of breath (before exercise)., Disp: 18 g, Rfl: 2   azithromycin (ZITHROMAX) 250 MG tablet, TAKE 2 TABLETS BY MOUTH TODAY, THEN TAKE 1 TABLET DAILY FOR 4 DAYS, Disp: , Rfl:    benzonatate (TESSALON) 200 MG capsule, SMARTSIG:1 Capsule(s) By Mouth 2-3 Times Daily PRN, Disp: , Rfl:    brompheniramine-pseudoephedrine-DM 30-2-10 MG/5ML syrup, brompheniramine-pseudoephedrine-DM 2 mg-30 mg-10 mg/5 mL oral syrup, Disp: , Rfl:    cetirizine (ZYRTEC) 10 MG tablet, Take 1 tablet (10 mg total) by mouth daily as needed for allergies., Disp: 30 tablet, Rfl: 11   cyclobenzaprine (FLEXERIL) 10 MG tablet, Take 1 tablet (10 mg total) by mouth 3 (three) times daily as needed for muscle spasms., Disp: 30 tablet, Rfl: 0   Docusate Sodium (DSS) 100 MG CAPS, Take by mouth., Disp: , Rfl:    fluticasone (FLONASE) 50 MCG/ACT nasal spray, Place 1 spray into both nostrils daily., Disp: 16 g, Rfl: 6   HYDROcodone-acetaminophen (NORCO/VICODIN) 5-325 MG tablet, hydrocodone 5  mg-acetaminophen 325 mg tablet, Disp: , Rfl:    metroNIDAZOLE (FLAGYL) 500 MG tablet, metronidazole 500 mg tablet, Disp: , Rfl:    Multiple Vitamin (MULTIVITAMIN WITH MINERALS) TABS tablet, Take 1 tablet by mouth daily., Disp: , Rfl:    PAXLOVID, 300/100, 20 x 150 MG & 10 x '100MG'$  TBPK, Take 3 tablets by mouth 2 (two) times daily., Disp: , Rfl:    Pediatric Multiple Vitamins (FLINTSTONES PLUS EXTRA C) CHEW, Chew by mouth., Disp: , Rfl:    predniSONE (DELTASONE) 10 MG tablet, prednisone 10 mg tablet, Disp: , Rfl:    promethazine-dextromethorphan (PROMETHAZINE-DM) 6.25-15 MG/5ML syrup, Take 5 mLs by mouth every 6 (six) hours as needed., Disp: , Rfl:    terbinafine (LAMISIL) 250 MG tablet, Take 1 tablet (250 mg total) by mouth daily., Disp: 90 tablet, Rfl: 0   tiZANidine (ZANAFLEX) 4 MG tablet, tizanidine 4 mg tablet  TAKE 1 TABLET BY MOUTH EVERY 6 HOURS AS NEEDED FOR MUSCLE SPASM, Disp: , Rfl:    triamcinolone cream (KENALOG) 0.1 %, Apply 1 application topically 3 (three) times daily., Disp: , Rfl:   Social History   Tobacco Use  Smoking Status Never  Smokeless Tobacco Never    Allergies  Allergen Reactions   Penicillins Hives and Nausea And Vomiting    Has patient had a PCN reaction causing immediate rash, facial/tongue/throat swelling, SOB or lightheadedness  with hypotension: No Has patient had a PCN reaction causing severe rash involving mucus membranes or skin necrosis: Yes Has patient had a PCN reaction that required hospitalization No Has patient had a PCN reaction occurring within the last 10 years: No If all of the above answers are "NO", then may proceed with Cephalosporin use.    Ibuprofen Palpitations   Latex Rash   Objective:  There were no vitals filed for this visit. There is no height or weight on file to calculate BMI. Constitutional Well developed. Well nourished.  Vascular Dorsalis pedis pulses palpable bilaterally. Posterior tibial pulses palpable  bilaterally. Capillary refill normal to all digits.  No cyanosis or clubbing noted. Pedal hair growth normal.  Neurologic Normal speech. Oriented to person, place, and time. Epicritic sensation to light touch grossly present bilaterally.  Dermatologic Improving thickened elongated mycotic nature of bilateral hallux. Skin within normal limits  Orthopedic: Normal joint ROM without pain or crepitus bilaterally. No visible deformities. No bony tenderness.   Radiographs: None Assessment:   No diagnosis found.  Plan:  Patient was evaluated and treated and all questions answered.  Bilateral hallux onychomycosis -Clinically she has noticed considerable improvement and has healed up mostly to bilateral hallux.  She only took 1-1/11-monthas she was not able to tolerate it well.  I discussed with her that if it comes back she can come back and see me and we can discuss continuing it.  She states understanding   No follow-ups on file.

## 2021-09-13 ENCOUNTER — Ambulatory Visit (INDEPENDENT_AMBULATORY_CARE_PROVIDER_SITE_OTHER): Payer: Self-pay

## 2021-09-13 DIAGNOSIS — B351 Tinea unguium: Secondary | ICD-10-CM

## 2021-09-13 NOTE — Progress Notes (Signed)
Patient presents today for the 4th laser treatment. Diagnosed with mycotic nail infection by Dr. Posey Pronto.   Toenail most affected bilateral hallux. She is happy with the progress so far.  All other systems are negative.  Nails were filed thin. Laser therapy was administered to bilateral hallux  toenails  and patient tolerated the treatment well. All safety precautions were in place.   Patient has completed 60 of the 90 days of lamisil.  Follow up in 6 weeks for laser # 5.

## 2021-10-29 ENCOUNTER — Other Ambulatory Visit: Payer: Self-pay

## 2021-11-04 ENCOUNTER — Other Ambulatory Visit: Payer: Self-pay

## 2021-11-05 ENCOUNTER — Other Ambulatory Visit: Payer: Self-pay

## 2021-11-15 ENCOUNTER — Ambulatory Visit (INDEPENDENT_AMBULATORY_CARE_PROVIDER_SITE_OTHER): Payer: 59 | Admitting: Family Medicine

## 2021-11-15 ENCOUNTER — Encounter: Payer: Self-pay | Admitting: Family Medicine

## 2021-11-15 VITALS — BP 152/86 | HR 90 | Ht 67.5 in | Wt 345.1 lb

## 2021-11-15 DIAGNOSIS — I10 Essential (primary) hypertension: Secondary | ICD-10-CM | POA: Diagnosis not present

## 2021-11-15 DIAGNOSIS — R7301 Impaired fasting glucose: Secondary | ICD-10-CM | POA: Diagnosis not present

## 2021-11-15 DIAGNOSIS — Z1159 Encounter for screening for other viral diseases: Secondary | ICD-10-CM

## 2021-11-15 DIAGNOSIS — J4520 Mild intermittent asthma, uncomplicated: Secondary | ICD-10-CM

## 2021-11-15 DIAGNOSIS — B351 Tinea unguium: Secondary | ICD-10-CM

## 2021-11-15 DIAGNOSIS — E559 Vitamin D deficiency, unspecified: Secondary | ICD-10-CM | POA: Diagnosis not present

## 2021-11-15 DIAGNOSIS — Z6841 Body Mass Index (BMI) 40.0 and over, adult: Secondary | ICD-10-CM

## 2021-11-15 MED ORDER — ALBUTEROL SULFATE HFA 108 (90 BASE) MCG/ACT IN AERS: 2.0000 | INHALATION_SPRAY | RESPIRATORY_TRACT | 2 refills | Status: DC | PRN

## 2021-11-15 MED ORDER — TERBINAFINE HCL 250 MG PO TABS: 250.0000 mg | ORAL_TABLET | Freq: Every day | ORAL | 1 refills | Status: DC

## 2021-11-15 NOTE — Assessment & Plan Note (Addendum)
Uncontrolled Pt is not on any antihypertensives She denies headaches, dizziness, and blurred vision Pt will like to implement lifestyle changes for a 1 month before being placed on antihypertensive Dash eating plan reviewed Encouraged limiting sodium intake No medication was ordered today, as pt stated that she would implement lifestyle changes. Will f/u in 1 month

## 2021-11-15 NOTE — Progress Notes (Signed)
New Patient Office Visit  Subjective:  Patient ID: Carla [name], female    DOB: 08-26-1980  Age: 41 y.o. MRN: [id number]  CC:  Chief Complaint  Patient presents with   New Patient (Initial Visit)    Pt establishing care was referred by obgyn due to bp being elevated.     HPI Carla [name] is a 41 y.o. female with past medical history of HTN presents for establishing care. HTN: unontrolled. Pt is not on any antihypertensives. She denies headaches, dizziness, and blurred vision.  Past Medical History:  Diagnosis Date   Allergy    Asthma    Fibroids    Obesity    Pregnancy induced hypertension     Past Surgical History:  Procedure Laterality Date   CERVICAL CERCLAGE  2003, 2008   CERVICAL CERCLAGE N/A 04/12/2016   Procedure: CERCLAGE CERVICAL;  Surgeon: Bobbye Charleston, MD;  Location: [facility];  Service: Gynecology;  Laterality: N/A;   CERVICAL CERCLAGE N/A 09/23/2016   Procedure: removal CERCLAGE CERVICAL;  Surgeon: Sanjuana Kava, MD;  Location: [facility];  Service: Obstetrics;  Laterality: N/A;   CESAREAN SECTION N/A 09/23/2016   Procedure: CESAREAN SECTION;  Surgeon: Sanjuana Kava, MD;  Location: [facility];  Service: Obstetrics;  Laterality: N/A;  RNFA  [facility] T   DILATION AND CURETTAGE OF UTERUS     ectopic     MYOMECTOMY  2016   fibroid removal    Family History  Problem Relation Age of Onset   Hypertension Mother    Alzheimer's disease Maternal Grandmother    Cancer Maternal Grandfather    Kidney disease Paternal Grandmother    Diabetes Paternal Grandmother    Cancer Paternal Grandfather    Asthma Brother    Heart disease Maternal Aunt    Learning disabilities Paternal Uncle    Cancer Paternal Uncle    Heart disease Paternal Uncle    Diabetes Paternal Uncle    Diabetes Maternal Uncle    Cancer Maternal Uncle    Diabetes Paternal Aunt     Social History   Socioeconomic History   Marital status: Married    Spouse name: Not on file   Number  of children: Not on file   Years of education: Not on file   Highest education level: Not on file  Occupational History   Not on file  Tobacco Use   Smoking status: Never   Smokeless tobacco: Never  Substance and Sexual Activity   Alcohol use: Yes   Drug use: No   Sexual activity: Yes    Birth control/protection: None  Other Topics Concern   Not on file  Social History Narrative   Not on file   Social Determinants of Health   Financial Resource Strain: Not on file  Food Insecurity: Not on file  Transportation Needs: Not on file  Physical Activity: Not on file  Stress: Not on file  Social Connections: Not on file  Intimate Partner Violence: Not on file    ROS Review of Systems  Constitutional:  Negative for chills, fatigue and fever.  HENT:  Negative for sinus pressure, sneezing and sore throat.   Eyes:  Negative for photophobia, pain and redness.  Respiratory:  Negative for chest tightness and shortness of breath.   Cardiovascular:  Negative for chest pain and palpitations.  Endocrine: Negative for polydipsia, polyphagia and polyuria.  Genitourinary:  Negative for vaginal bleeding, vaginal discharge and vaginal pain.  Musculoskeletal:  Negative for gait problem and neck pain.  Skin:  Negative for rash and wound.  Neurological:  Negative for dizziness, weakness and headaches.  Psychiatric/Behavioral:  Negative for self-injury and suicidal ideas.     Objective:   Today's Vitals: BP (!) 152/86   Pulse 90   Ht 5' 7.5" (1.715 m)   Wt (!) 345 lb 1.9 oz (156.5 kg)   SpO2 96%   BMI 53.26 kg/m   Physical Exam HENT:     Head: Normocephalic.     Right Ear: External ear normal.     Left Ear: External ear normal.     Nose: No congestion.     Mouth/Throat:     Mouth: Mucous membranes are moist.  Eyes:     Extraocular Movements: Extraocular movements intact.     Pupils: Pupils are equal, round, and reactive to light.  Cardiovascular:     Rate and Rhythm: Normal rate  and regular rhythm.     Pulses: Normal pulses.     Heart sounds: Normal heart sounds.  Pulmonary:     Effort: Pulmonary effort is normal.     Breath sounds: Normal breath sounds.  Abdominal:     Palpations: Abdomen is soft.  Musculoskeletal:     Cervical back: Normal range of motion and neck supple.     Right lower leg: No edema.     Left lower leg: No edema.  Skin:    General: Skin is warm and dry.  Neurological:     Mental Status: She is alert and oriented to person, place, and time.  Psychiatric:     Comments: Normal affect     Assessment & Plan:   Problem List Items Addressed This Visit       Cardiovascular and Mediastinum   Essential hypertension - Primary    Uncontrolled Pt is not on any antihypertensives She denies headaches, dizziness, and blurred vision Pt will like to implement lifestyle changes for a 1 month before being placed on antihypertensive Dash eating plan reviewed Encouraged limiting sodium intake No medication was ordered today, as pt stated that she would implement lifestyle changes. Will f/u in 1 month        Respiratory   Asthma   Relevant Medications   albuterol (VENTOLIN HFA) 108 (90 Base) MCG/ACT inhaler     Other   Obesity   Relevant Orders   CBC with Differential/Platelet   CMP14+EGFR   TSH + free T4   Lipid Profile   Other Visit Diagnoses     IFG (impaired fasting glucose)       Relevant Orders   Hemoglobin A1C   Vitamin D deficiency       Relevant Orders   Vitamin D (25 hydroxy)   Onychomycosis       Relevant Medications   terbinafine (LAMISIL) 250 MG tablet   terbinafine (LAMISIL) 250 MG tablet   Need for hepatitis C screening test       Relevant Orders   Hepatitis C antibody       Outpatient Encounter Medications as of 11/15/2021  Medication Sig   cetirizine (ZYRTEC) 10 MG tablet Take 1 tablet (10 mg total) by mouth daily as needed for allergies.   cyclobenzaprine (FLEXERIL) 10 MG tablet Take 1 tablet (10 mg  total) by mouth 3 (three) times daily as needed for muscle spasms.   fluticasone (FLONASE) 50 MCG/ACT nasal spray Place 1 spray into both nostrils daily.   Multiple Vitamin (MULTIVITAMIN WITH MINERALS) TABS tablet Take 1 tablet by mouth daily.  predniSONE (DELTASONE) 10 MG tablet prednisone 10 mg tablet   tiZANidine (ZANAFLEX) 4 MG tablet tizanidine 4 mg tablet  TAKE 1 TABLET BY MOUTH EVERY 6 HOURS AS NEEDED FOR MUSCLE SPASM   triamcinolone cream (KENALOG) 0.1 % Apply 1 application topically 3 (three) times daily.   [DISCONTINUED] albuterol (VENTOLIN HFA) 108 (90 Base) MCG/ACT inhaler Inhale 2 puffs into the lungs every 4 (four) hours as needed for wheezing or shortness of breath (before exercise).   [DISCONTINUED] azithromycin (ZITHROMAX) 250 MG tablet TAKE 2 TABLETS BY MOUTH TODAY, THEN TAKE 1 TABLET DAILY FOR 4 DAYS   [DISCONTINUED] benzonatate (TESSALON) 200 MG capsule SMARTSIG:1 Capsule(s) By Mouth 2-3 Times Daily PRN   [DISCONTINUED] brompheniramine-pseudoephedrine-DM 30-2-10 MG/5ML syrup brompheniramine-pseudoephedrine-DM 2 mg-30 mg-10 mg/5 mL oral syrup   [DISCONTINUED] Docusate Sodium (DSS) 100 MG CAPS Take by mouth.   [DISCONTINUED] HYDROcodone-acetaminophen (NORCO/VICODIN) 5-325 MG tablet hydrocodone 5 mg-acetaminophen 325 mg tablet   [DISCONTINUED] metroNIDAZOLE (FLAGYL) 500 MG tablet metronidazole 500 mg tablet   [DISCONTINUED] PAXLOVID, 300/100, 20 x 150 MG & 10 x 100MG TBPK Take 3 tablets by mouth 2 (two) times daily.   [DISCONTINUED] Pediatric Multiple Vitamins (FLINTSTONES PLUS EXTRA C) CHEW Chew by mouth.   [DISCONTINUED] promethazine-dextromethorphan (PROMETHAZINE-DM) 6.25-15 MG/5ML syrup Take 5 mLs by mouth every 6 (six) hours as needed.   [DISCONTINUED] terbinafine (LAMISIL) 250 MG tablet Take 1 tablet (250 mg total) by mouth daily.   albuterol (VENTOLIN HFA) 108 (90 Base) MCG/ACT inhaler Inhale 2 puffs into the lungs every 4 (four) hours as needed for wheezing or shortness of  breath (before exercise).   terbinafine (LAMISIL) 250 MG tablet Take 1 tablet by mouth daily.   terbinafine (LAMISIL) 250 MG tablet Take 1 tablet (250 mg total) by mouth daily.   No facility-administered encounter medications on file as of 11/15/2021.    Follow-up: Return in about 1 month (around 12/16/2021) for BP.   Alvira Monday, FNP

## 2021-11-15 NOTE — Patient Instructions (Signed)
I appreciate the opportunity to provide care to you today!   Follow up:  1 months for BP  Labs: please stop by the lab during the week to get your blood drawn (CBC, CMP, TSH, Lipid profile, HgA1c, Vit D)  Screening: Hep C    Please continue to a heart-healthy diet and increase your physical activities. Try to exercise for 101mns at least three times a week.      It was a pleasure to see you and I look forward to continuing to work together on your health and well-being. Please do not hesitate to call the office if you need care or have questions about your care.   Have a wonderful day and week. With Gratitude, GAlvira MondayMSN, FNP-BC

## 2021-12-20 ENCOUNTER — Ambulatory Visit: Payer: BC Managed Care – PPO | Admitting: Family Medicine

## 2021-12-27 ENCOUNTER — Ambulatory Visit (INDEPENDENT_AMBULATORY_CARE_PROVIDER_SITE_OTHER): Payer: 59

## 2021-12-27 DIAGNOSIS — B351 Tinea unguium: Secondary | ICD-10-CM

## 2021-12-27 NOTE — Progress Notes (Signed)
Patient presents today for the 5th laser treatment. Diagnosed with mycotic nail infection by Dr. Posey Pronto.   Toenail most affected bilateral hallux. She is happy with the progress so far.  All other systems are negative.  Nails were filed thin. Laser therapy was administered to bilateral hallux  toenails  and patient tolerated the treatment well. All safety precautions were in place.   Patient has completed 6 weeks of lamisil and stopped due to upset stomach.  Follow up in 8 weeks for laser # 6.

## 2022-02-25 ENCOUNTER — Other Ambulatory Visit: Payer: 59

## 2022-04-19 ENCOUNTER — Ambulatory Visit (INDEPENDENT_AMBULATORY_CARE_PROVIDER_SITE_OTHER): Payer: 59

## 2022-04-19 DIAGNOSIS — B351 Tinea unguium: Secondary | ICD-10-CM

## 2022-04-19 NOTE — Patient Instructions (Signed)

## 2022-04-19 NOTE — Progress Notes (Signed)
Patient presents today for the 6th laser treatment. Diagnosed with mycotic nail infection by Dr. Posey Pronto.   Toenail most affected bilateral hallux. She is happy with the progress so far.  All other systems are negative.  Nails were filed thin. Laser therapy was administered to bilateral hallux  toenails  and patient tolerated the treatment well. All safety precautions were in place.   Patient has completed 6 weeks of lamisil and stopped due to upset stomach.  Patient has completed the recommended laser treatments. He will follow up with Dr. Posey Pronto in 3 months to evaluate progress.

## 2022-05-04 ENCOUNTER — Ambulatory Visit: Payer: 59 | Admitting: Podiatry

## 2022-07-20 ENCOUNTER — Ambulatory Visit: Payer: 59 | Admitting: Podiatry

## 2022-11-29 ENCOUNTER — Ambulatory Visit (INDEPENDENT_AMBULATORY_CARE_PROVIDER_SITE_OTHER): Payer: 59 | Admitting: Family Medicine

## 2022-11-29 ENCOUNTER — Encounter: Payer: Self-pay | Admitting: Family Medicine

## 2022-11-29 VITALS — BP 160/100 | HR 72 | Temp 98.7°F | Ht 67.0 in | Wt 368.0 lb

## 2022-11-29 DIAGNOSIS — I10 Essential (primary) hypertension: Secondary | ICD-10-CM | POA: Diagnosis not present

## 2022-11-29 DIAGNOSIS — Z1159 Encounter for screening for other viral diseases: Secondary | ICD-10-CM

## 2022-11-29 DIAGNOSIS — Z7689 Persons encountering health services in other specified circumstances: Secondary | ICD-10-CM | POA: Insufficient documentation

## 2022-11-29 DIAGNOSIS — Z6841 Body Mass Index (BMI) 40.0 and over, adult: Secondary | ICD-10-CM | POA: Diagnosis not present

## 2022-11-29 NOTE — Patient Instructions (Signed)
It was great to meet you today and I'm excited to have you join the [facility] practice. I hope you had a positive experience today! If you feel so inclined, please feel free to recommend our practice to friends and family. Mila Merry, FNP-C

## 2022-11-29 NOTE — Progress Notes (Signed)
New Patient Office Visit  Subjective    Patient ID: Carla [name], female    DOB: 05/30/1980  Age: 42 y.o. MRN: [id number]  CC:  Chief Complaint  Patient presents with   Establish Care    HPI [name] presents to establish care. Oriented to practice routines and expectations. Has been seeing PCP regularly. PMH includes HTN, allergies, asthma, obesity, and uterine fibroids. Her blood pressure is currently uncontrolled in office, she is not monitoring at home, has tried hydrochlorothiazide in past with vomiting and hypotension. She also had a cough when she tried to take Amlodipine. She reports better blood pressures at home. Denies chest pain, palpitations, shortness of breath, recurrent headaches, vision changes, swelling of extremities. Concerns include her weight. Has been approved for bariatric surgery in the past 2x with interruptions in the treatment plan, has tried [place] with 40lb weight loss that she has gained back. She [facility] upcoming [facility]. She does consume a low sodium diet, she consumes 1700-1800 calories per day and has no exercise routine. She denies personal or family history of MEN2 or thyroid tumors and no personal history of pancreatitis.  Breast CA screening: Mammogram status: Completed 12/2021. Repeat every year Cervical CA screening: was normal few weeks ago Tobacco: non-smoker STI: declines Vaccines:  UTD    Outpatient Encounter Medications as of 11/29/2022  Medication Sig   albuterol (VENTOLIN HFA) 108 (90 Base) MCG/ACT inhaler Inhale 2 puffs into the lungs every 4 (four) hours as needed for wheezing or shortness of breath (before exercise).   cetirizine (ZYRTEC) 10 MG tablet Take 1 tablet (10 mg total) by mouth daily as needed for allergies.   cyclobenzaprine (FLEXERIL) 10 MG tablet Take 1 tablet (10 mg total) by mouth 3 (three) times daily as needed for muscle spasms.   fluticasone (FLONASE) 50 MCG/ACT nasal  spray Place 1 spray into both nostrils daily.   Multiple Vitamin (MULTIVITAMIN WITH MINERALS) TABS tablet Take 1 tablet by mouth daily.   tiZANidine (ZANAFLEX) 4 MG tablet tizanidine 4 mg tablet  TAKE 1 TABLET BY MOUTH EVERY 6 HOURS AS NEEDED FOR MUSCLE SPASM   [DISCONTINUED] predniSONE (DELTASONE) 10 MG tablet prednisone 10 mg tablet (Patient not taking: Reported on 11/29/2022)   [DISCONTINUED] terbinafine (LAMISIL) 250 MG tablet Take 1 tablet by mouth daily. (Patient not taking: Reported on 11/29/2022)   [DISCONTINUED] terbinafine (LAMISIL) 250 MG tablet Take 1 tablet (250 mg total) by mouth daily. (Patient not taking: Reported on 11/29/2022)   [DISCONTINUED] triamcinolone cream (KENALOG) 0.1 % Apply 1 application topically 3 (three) times daily. (Patient not taking: Reported on 11/29/2022)   No facility-administered encounter medications on file as of 11/29/2022.    Past Medical History:  Diagnosis Date   Allergy    Asthma    Fibroids    Obesity    Pregnancy induced hypertension     Past Surgical History:  Procedure Laterality Date   CERVICAL CERCLAGE  2003, 2008   CERVICAL CERCLAGE N/A 04/12/2016   Procedure: CERCLAGE CERVICAL;  Surgeon: Carrington Clamp, MD;  Location: [facility];  Service: Gynecology;  Laterality: N/A;   CERVICAL CERCLAGE N/A 09/23/2016   Procedure: removal CERCLAGE CERVICAL;  Surgeon: Essie Hart, MD;  Location: [facility];  Service: Obstetrics;  Laterality: N/A;   CESAREAN SECTION N/A 09/23/2016   Procedure: CESAREAN SECTION;  Surgeon: Essie Hart, MD;  Location: [facility];  Service: Obstetrics;  Laterality: N/A;  RNFA  [facility]  DILATION AND CURETTAGE OF UTERUS     ectopic     MYOMECTOMY  2016   fibroid removal    Family History  Problem Relation Age of Onset   Hypertension Mother    Alzheimer's disease Maternal Grandmother    Cancer Maternal Grandfather    Kidney disease Paternal Grandmother    Diabetes Paternal Grandmother    Cancer Paternal  Grandfather    Asthma Brother    Heart disease Maternal Aunt    Learning disabilities Paternal Uncle    Cancer Paternal Uncle    Heart disease Paternal Uncle    Diabetes Paternal Uncle    Diabetes Maternal Uncle    Cancer Maternal Uncle    Diabetes Paternal Aunt     Social History   Socioeconomic History   Marital status: Married    Spouse name: Not on file   Number of children: Not on file   Years of education: Not on file   Highest education level: Not on file  Occupational History   Not on file  Tobacco Use   Smoking status: Never   Smokeless tobacco: Never  Substance and Sexual Activity   Alcohol use: Yes   Drug use: No   Sexual activity: Yes    Birth control/protection: None  Other Topics Concern   Not on file  Social History Narrative   Not on file   Social Determinants of Health   Financial Resource Strain: Not on file  Food Insecurity: Not on file  Transportation Needs: Not on file  Physical Activity: Not on file  Stress: Not on file  Social Connections: Not on file  Intimate Partner Violence: Not on file    Review of Systems  All other systems reviewed and are negative.       Objective    BP (!) 160/100   Pulse 72   Temp 98.7 F (37.1 C) (Oral)   Ht 5\' 7"  (1.702 m)   Wt (!) 368 lb (166.9 kg)   LMP 10/18/2022 (Approximate)   SpO2 98%   BMI 57.64 kg/m   Physical Exam Vitals and nursing note reviewed.  Constitutional:      Appearance: Normal appearance. She is obese.  HENT:     Head: Normocephalic and atraumatic.  Cardiovascular:     Rate and Rhythm: Normal rate and regular rhythm.     Pulses: Normal pulses.     Heart sounds: Normal heart sounds.  Pulmonary:     Effort: Pulmonary effort is normal.     Breath sounds: Normal breath sounds.  Skin:    General: Skin is warm and dry.  Neurological:     General: No focal deficit present.     Mental Status: She is alert and oriented to person, place, and time. Mental status is at  baseline.  Psychiatric:        Mood and Affect: Mood normal.        Behavior: Behavior normal.        Thought Content: Thought content normal.        Judgment: Judgment normal.         Assessment & Plan:   Problem List Items Addressed This Visit     Obesity    Counseled on importance of weight management for overall health. Encouraged low calorie, heart healthy diet and moderate intensity exercise 150 minutes weekly. This is 3-5 times weekly for 30-50 minutes each session. Goal should be pace of 3 miles/hours, or walking 1.5 miles in 30 minutes  and include strength training. Discussed risks of obesity. Will refer to [facility]. She will also check with her HR and determine medication coverage as she is interested in Zepbound.        Relevant Orders   CBC with Differential/Platelet   COMPLETE METABOLIC PANEL WITH GFR   Lipid panel   TSH   Amb Ref to Medical Weight Management   Essential hypertension    BP 160/100 today in office, she reports BP much better at home and will montior for 1 week and record values and return to office with home cuff. Did not tolerate Amlodipine or hydrochlorothiazide in the past but will likely need medication management. Encourage heart healthy diet and 150 minutes of moderate intensity exercise weekly. Seek medical care for chest pain, palpitations, SOB, recurrent headaches, vision changes, swelling of extremities.      Encounter to establish care with new doctor - Primary    Today we reviewed your medical history and current concerns. We also addressed any outstanding health maintenance items. Will return to office for fasting labs and follow up appointment.      Other Visit Diagnoses     Need for hepatitis C screening test       Relevant Orders   Hepatitis C antibody       Return in about 2 weeks (around 12/13/2022) for hypertension.   Park Meo, FNP

## 2022-11-29 NOTE — Assessment & Plan Note (Signed)
BP 160/100 today in office, she reports BP much better at home and will montior for 1 week and record values and return to office with home cuff. Did not tolerate Amlodipine or hydrochlorothiazide in the past but will likely need medication management. Encourage heart healthy diet and 150 minutes of moderate intensity exercise weekly. Seek medical care for chest pain, palpitations, SOB, recurrent headaches, vision changes, swelling of extremities.

## 2022-11-29 NOTE — Assessment & Plan Note (Signed)
Today we reviewed your medical history and current concerns. We also addressed any outstanding health maintenance items. Will return to office for fasting labs and follow up appointment.

## 2022-11-29 NOTE — Assessment & Plan Note (Signed)
Counseled on importance of weight management for overall health. Encouraged low calorie, heart healthy diet and moderate intensity exercise 150 minutes weekly. This is 3-5 times weekly for 30-50 minutes each session. Goal should be pace of 3 miles/hours, or walking 1.5 miles in 30 minutes and include strength training. Discussed risks of obesity. Will refer to [facility]. She will also check with her HR and determine medication coverage as she is interested in Zepbound.

## 2022-12-01 ENCOUNTER — Other Ambulatory Visit: Payer: BC Managed Care – PPO

## 2022-12-02 ENCOUNTER — Encounter: Payer: Self-pay | Admitting: Pharmacist

## 2022-12-05 ENCOUNTER — Telehealth: Payer: Self-pay

## 2022-12-05 NOTE — Telephone Encounter (Signed)
Physician results form completed per Kurtis Bushman, FNP. Put up front in file drawer. Call pt and aware, will pick up tom.

## 2022-12-06 ENCOUNTER — Ambulatory Visit: Payer: 59 | Admitting: Family Medicine

## 2022-12-06 ENCOUNTER — Telehealth: Payer: Self-pay | Admitting: Family Medicine

## 2022-12-06 ENCOUNTER — Ambulatory Visit (INDEPENDENT_AMBULATORY_CARE_PROVIDER_SITE_OTHER): Payer: 59 | Admitting: Family Medicine

## 2022-12-06 ENCOUNTER — Encounter: Payer: Self-pay | Admitting: Family Medicine

## 2022-12-06 VITALS — BP 160/100 | HR 72 | Temp 97.7°F | Ht 67.0 in | Wt 377.0 lb

## 2022-12-06 DIAGNOSIS — I10 Essential (primary) hypertension: Secondary | ICD-10-CM

## 2022-12-06 DIAGNOSIS — Z6841 Body Mass Index (BMI) 40.0 and over, adult: Secondary | ICD-10-CM

## 2022-12-06 LAB — CBC WITH DIFFERENTIAL/PLATELET
Absolute Monocytes: 357 {cells}/uL (ref 200–950)
Basophils Absolute: 30 {cells}/uL (ref 0–200)
Basophils Relative: 0.7 %
Eosinophils Absolute: 99 {cells}/uL (ref 15–500)
Eosinophils Relative: 2.3 %
HCT: 36.8 % (ref 35.0–45.0)
Hemoglobin: 12.2 g/dL (ref 11.7–15.5)
Lymphs Abs: 1299 {cells}/uL (ref 850–3900)
MCH: 30.5 pg (ref 27.0–33.0)
MCHC: 33.2 g/dL (ref 32.0–36.0)
MCV: 92 fL (ref 80.0–100.0)
MPV: 11.1 fL (ref 7.5–12.5)
Monocytes Relative: 8.3 %
Neutro Abs: 2516 {cells}/uL (ref 1500–7800)
Neutrophils Relative %: 58.5 %
Platelets: 333 10*3/uL (ref 140–400)
RBC: 4 10*6/uL (ref 3.80–5.10)
RDW: 14.2 % (ref 11.0–15.0)
Total Lymphocyte: 30.2 %
WBC: 4.3 10*3/uL (ref 3.8–10.8)

## 2022-12-06 LAB — COMPLETE METABOLIC PANEL WITH GFR
AG Ratio: 1.6 (calc) (ref 1.0–2.5)
ALT: 14 U/L (ref 6–29)
AST: 14 U/L (ref 10–30)
Albumin: 4 g/dL (ref 3.6–5.1)
Alkaline phosphatase (APISO): 68 U/L (ref 31–125)
BUN: 19 mg/dL (ref 7–25)
CO2: 26 mmol/L (ref 20–32)
Calcium: 8.9 mg/dL (ref 8.6–10.2)
Chloride: 105 mmol/L (ref 98–110)
Creat: 0.9 mg/dL (ref 0.50–0.99)
Globulin: 2.5 g/dL (ref 1.9–3.7)
Glucose, Bld: 98 mg/dL (ref 65–99)
Potassium: 4.8 mmol/L (ref 3.5–5.3)
Sodium: 139 mmol/L (ref 135–146)
Total Bilirubin: 0.3 mg/dL (ref 0.2–1.2)
Total Protein: 6.5 g/dL (ref 6.1–8.1)
eGFR: 82 mL/min/{1.73_m2} (ref >=60)

## 2022-12-06 LAB — TEST AUTHORIZATION

## 2022-12-06 LAB — LIPID PANEL
Cholesterol: 211 mg/dL — ABNORMAL HIGH (ref <200)
HDL: 83 mg/dL (ref >=50)
LDL Cholesterol (Calc): 113 mg/dL — ABNORMAL HIGH
Non-HDL Cholesterol (Calc): 128 mg/dL (ref <130)
Total CHOL/HDL Ratio: 2.5 (calc) (ref <5.0)
Triglycerides: 63 mg/dL (ref <150)

## 2022-12-06 LAB — TSH: TSH: 2.46 m[IU]/L

## 2022-12-06 LAB — HEMOGLOBIN A1C
Hgb A1c MFr Bld: 5.9 %{Hb} — ABNORMAL HIGH (ref <5.7)
Mean Plasma Glucose: 123 mg/dL
eAG (mmol/L): 6.8 mmol/L

## 2022-12-06 LAB — HEPATITIS C ANTIBODY: Hepatitis C Ab: NONREACTIVE

## 2022-12-06 MED ORDER — LISINOPRIL 10 MG PO TABS
10.0000 mg | ORAL_TABLET | Freq: Every day | ORAL | 3 refills | Status: DC
Start: 2022-12-06 — End: 2023-01-16

## 2022-12-06 MED ORDER — TIRZEPATIDE 2.5 MG/0.5ML ~~LOC~~ SOAJ
SUBCUTANEOUS | 0 refills | Status: DC
Start: 2022-12-06 — End: 2022-12-08

## 2022-12-06 NOTE — Progress Notes (Signed)
Subjective:  HPI: Carla [name] is a 42 y.o. female presenting on 12/06/2022 for Follow-up (1 wk f/u)   HPI Patient is in today for follow-up for her elevated blood pressure as well as weight loss. She has not been monitoring her BP at home but in office readings remain elevated. She denies chest pain, palpitations, dyspnea with exertion, vision changes, recurrent headaches, or worsening swelling of extremities.   She has checked with her employer since our discussion at her last visit and weight management medications are approved and she would like to start [facility].Confirmed no PMH of pancreatitis and no personal or family history of MEN2 or MTC.  The 10-year ASCVD risk score (Arnett DK, et al., 2019) is: 2.2%   Values used to calculate the score:     Age: 6 years     Sex: Female     Is Non-Hispanic African American: Yes     Diabetic: No     Tobacco smoker: No     Systolic Blood Pressure: 160 mmHg     Is BP treated: Yes     HDL Cholesterol: 83 mg/dL     Total Cholesterol: 211 mg/dL   Review of Systems  All other systems reviewed and are negative.   Relevant past medical history reviewed and updated as indicated.   Past Medical History:  Diagnosis Date   Allergy    Asthma    Fibroids    Obesity    Pregnancy induced hypertension      Past Surgical History:  Procedure Laterality Date   CERVICAL CERCLAGE  2003, 2008   CERVICAL CERCLAGE N/A 04/12/2016   Procedure: CERCLAGE CERVICAL;  Surgeon: Carrington Clamp, MD;  Location: [facility];  Service: Gynecology;  Laterality: N/A;   CERVICAL CERCLAGE N/A 09/23/2016   Procedure: removal CERCLAGE CERVICAL;  Surgeon: Essie Hart, MD;  Location: [facility] BIRTHING SUITES;  Service: Obstetrics;  Laterality: N/A;   CESAREAN SECTION N/A 09/23/2016   Procedure: CESAREAN SECTION;  Surgeon: Essie Hart, MD;  Location: [facility] BIRTHING SUITES;  Service: Obstetrics;  Laterality: N/A;  RNFA  Tracey T   DILATION AND CURETTAGE OF UTERUS     ectopic      MYOMECTOMY  2016   fibroid removal    Allergies and medications reviewed and updated.   Current Outpatient Medications:    albuterol (VENTOLIN HFA) 108 (90 Base) MCG/ACT inhaler, Inhale 2 puffs into the lungs every 4 (four) hours as needed for wheezing or shortness of breath (before exercise)., Disp: 18 g, Rfl: 2   cetirizine (ZYRTEC) 10 MG tablet, Take 1 tablet (10 mg total) by mouth daily as needed for allergies., Disp: 30 tablet, Rfl: 11   cyclobenzaprine (FLEXERIL) 10 MG tablet, Take 1 tablet (10 mg total) by mouth 3 (three) times daily as needed for muscle spasms., Disp: 30 tablet, Rfl: 0   fluticasone (FLONASE) 50 MCG/ACT nasal spray, Place 1 spray into both nostrils daily., Disp: 16 g, Rfl: 6   lisinopril (ZESTRIL) 10 MG tablet, Take 1 tablet (10 mg total) by mouth daily., Disp: 90 tablet, Rfl: 3   Multiple Vitamin (MULTIVITAMIN WITH MINERALS) TABS tablet, Take 1 tablet by mouth daily., Disp: , Rfl:    tirzepatide (MOUNJARO) 2.5 MG/0.5ML Pen, Inject 2.5 mg into the skin once a week for 28 days, THEN 5 mg once a week for 28 days., Disp: 6 mL, Rfl: 0   tiZANidine (ZANAFLEX) 4 MG tablet, tizanidine 4 mg tablet  TAKE 1 TABLET BY MOUTH EVERY 6 HOURS  AS NEEDED FOR MUSCLE SPASM, Disp: , Rfl:   Allergies  Allergen Reactions   Penicillins Hives and Nausea And Vomiting    Has patient had a PCN reaction causing immediate rash, facial/tongue/throat swelling, SOB or lightheadedness with hypotension: No Has patient had a PCN reaction causing severe rash involving mucus membranes or skin necrosis: Yes Has patient had a PCN reaction that required hospitalization No Has patient had a PCN reaction occurring within the last 10 years: No If all of the above answers are "NO", then may proceed with Cephalosporin use.    Ibuprofen Palpitations   Latex Rash    Objective:   BP (!) 160/100   Pulse 72   Temp 97.7 F (36.5 C) (Oral)   Ht 5\' 7"  (1.702 m)   Wt (!) 377 lb (171 kg)   LMP 12/04/2022    SpO2 98%   BMI 59.05 kg/m      12/06/2022    2:41 PM 12/06/2022    2:35 PM 11/29/2022    3:22 PM  Vitals with BMI  Height  5\' 7"    Weight  377 lbs   BMI  59.03   Systolic 160 162 161  Diastolic 100 110 096  Pulse  72      Physical Exam Vitals and nursing note reviewed.  Constitutional:      Appearance: Normal appearance. She is obese.  HENT:     Head: Normocephalic and atraumatic.  Cardiovascular:     Rate and Rhythm: Normal rate and regular rhythm.     Pulses: Normal pulses.     Heart sounds: Normal heart sounds.  Pulmonary:     Effort: Pulmonary effort is normal.     Breath sounds: Normal breath sounds.  Skin:    General: Skin is warm and dry.  Neurological:     General: No focal deficit present.     Mental Status: She is alert and oriented to person, place, and time. Mental status is at baseline.  Psychiatric:        Mood and Affect: Mood normal.        Behavior: Behavior normal.        Thought Content: Thought content normal.        Judgment: Judgment normal.     Assessment & Plan:  Essential hypertension Assessment & Plan: BP 160/100 today in office, she has not been monitoring at home. Start Lisinopril 10mg  daily. Encourage heart healthy diet and 150 minutes of moderate intensity exercise weekly. Seek medical care for chest pain, palpitations, SOB, recurrent headaches, vision changes, swelling of extremities. Return to office in 1 month for follow-up.   Morbid obesity with BMI of 50.0-59.9, adult [facility]) Assessment & Plan: Discussed importance of weight management for overall health. She is interested in starting [place] and confirmed her coverage with her employer. Sample provided today in office. Will start Mounjaro 2.5mg  SQ weekly for 4 weeks then increase as tolerated. Return to office in 4 weeks or sooner if unable to tolerate.   Other orders -     Lisinopril; Take 1 tablet (10 mg total) by mouth daily.  Dispense: 90 tablet; Refill: 3 -     Tirzepatide;  Inject 2.5 mg into the skin once a week for 28 days, THEN 5 mg once a week for 28 days.  Dispense: 6 mL; Refill: 0     Follow up plan: Return in about 4 weeks (around 01/03/2023) for hypertension, weight management.  Park Meo, FNP

## 2022-12-06 NOTE — Telephone Encounter (Signed)
Patient called to follow up on refill for tirzepatide [facility]) 2.5 MG/0.5ML Pen ; insurance requires a PA.  Patient requesting for nurse to call her insurance company at [phone] to give an explanation of why medication is being prescribed. After that call is made, the pharmacy will release the medication.  Please advise patient with questions at [phone].

## 2022-12-06 NOTE — Assessment & Plan Note (Signed)
BP 160/100 today in office, she has not been monitoring at home. Start Lisinopril 10mg  daily. Encourage heart healthy diet and 150 minutes of moderate intensity exercise weekly. Seek medical care for chest pain, palpitations, SOB, recurrent headaches, vision changes, swelling of extremities. Return to office in 1 month for follow-up.

## 2022-12-06 NOTE — Assessment & Plan Note (Signed)
Discussed importance of weight management for overall health. She is interested in starting [place] and confirmed her coverage with her employer. Sample provided today in office. Will start Mounjaro 2.5mg  SQ weekly for 4 weeks then increase as tolerated. Return to office in 4 weeks or sooner if unable to tolerate.

## 2022-12-08 ENCOUNTER — Telehealth: Payer: Self-pay | Admitting: Family Medicine

## 2022-12-08 ENCOUNTER — Other Ambulatory Visit: Payer: Self-pay | Admitting: Family Medicine

## 2022-12-08 ENCOUNTER — Encounter: Payer: BC Managed Care – PPO | Admitting: Family Medicine

## 2022-12-08 MED ORDER — WEGOVY 0.25 MG/0.5ML ~~LOC~~ SOAJ
0.2500 mg | SUBCUTANEOUS | 0 refills | Status: DC
Start: 2022-12-08 — End: 2022-12-22

## 2022-12-08 MED ORDER — ZEPBOUND 2.5 MG/0.5ML ~~LOC~~ SOAJ
2.5000 mg | SUBCUTANEOUS | 0 refills | Status: DC
Start: 2022-12-08 — End: 2022-12-08

## 2022-12-08 NOTE — Telephone Encounter (Signed)
Per NP disregard PA-Monjaro iinsurance will not cover for wt loss only diabetes and pt is not diabetic

## 2022-12-08 NOTE — Telephone Encounter (Signed)
Contacted patient to discuss need to switch rx to Zepbound. Explained the plan to discontinue Mounjaro and start Zepbound 2.5mg  weekly for 4 weeks and uptitrate as tolerated. She has not obtained this from the pharmacy yet.

## 2022-12-13 NOTE — Telephone Encounter (Signed)
Patient called to follow up on refill requested for Tirzepatide Greggory Keen or Zepbound); stated pharmacy advised her they're awaiting additional information.  Please advise at [phone]

## 2022-12-15 ENCOUNTER — Encounter: Payer: BC Managed Care – PPO | Admitting: Family Medicine

## 2022-12-16 NOTE — Telephone Encounter (Signed)
Called and spoke w/pt and per pt never tried zepbound before and would like to have Rx for zepbound vs Wegovy.  Pls advise

## 2022-12-19 ENCOUNTER — Telehealth: Payer: Self-pay

## 2022-12-22 ENCOUNTER — Encounter: Payer: Self-pay | Admitting: Family Medicine

## 2022-12-22 ENCOUNTER — Other Ambulatory Visit: Payer: Self-pay | Admitting: Family Medicine

## 2022-12-22 ENCOUNTER — Telehealth: Payer: Self-pay

## 2022-12-22 MED ORDER — ZEPBOUND 5 MG/0.5ML ~~LOC~~ SOAJ
5.0000 mg | SUBCUTANEOUS | 0 refills | Status: DC
Start: 2023-01-20 — End: 2023-01-04

## 2022-12-22 MED ORDER — ZEPBOUND 2.5 MG/0.5ML ~~LOC~~ SOAJ
2.5000 mg | SUBCUTANEOUS | 0 refills | Status: DC
Start: 2022-12-22 — End: 2023-01-04

## 2022-12-22 NOTE — Telephone Encounter (Signed)
Patient called to follow up on request for Zepbound; stated the insurance company is waiting for information/approval from the provider. Patient stated the order has to be submitted as Zepbound, not Mounjaro, since it's approved for weight loss. Patient does not want to use Wegovy or Ozempic.   Patient unsure of what needs to be done next. She's been going back and forth between this office and the insurance company for a few weeks now.   Requesting an update. Please advise at [phone].

## 2022-12-23 ENCOUNTER — Other Ambulatory Visit: Payer: Self-pay | Admitting: Family Medicine

## 2022-12-23 NOTE — Telephone Encounter (Signed)
Error

## 2023-01-04 ENCOUNTER — Ambulatory Visit (INDEPENDENT_AMBULATORY_CARE_PROVIDER_SITE_OTHER): Payer: 59 | Admitting: Family Medicine

## 2023-01-04 ENCOUNTER — Encounter: Payer: Self-pay | Admitting: Family Medicine

## 2023-01-04 ENCOUNTER — Other Ambulatory Visit (HOSPITAL_COMMUNITY): Payer: Self-pay

## 2023-01-04 VITALS — BP 148/112 | HR 76 | Temp 98.2°F | Ht 67.0 in | Wt 351.0 lb

## 2023-01-04 DIAGNOSIS — Z6841 Body Mass Index (BMI) 40.0 and over, adult: Secondary | ICD-10-CM | POA: Diagnosis not present

## 2023-01-04 DIAGNOSIS — I10 Essential (primary) hypertension: Secondary | ICD-10-CM | POA: Diagnosis not present

## 2023-01-04 MED ORDER — TIRZEPATIDE-WEIGHT MANAGEMENT 2.5 MG/0.5ML ~~LOC~~ SOLN: 2.5000 mg | SUBCUTANEOUS | 0 refills | Status: DC

## 2023-01-04 MED ORDER — OMRON 3 SERIES BP MONITOR DEVI
1.0000 | Freq: Once | 0 refills | Status: AC
Start: 2023-01-04 — End: 2023-02-04
  Filled 2023-01-04 – 2023-02-03 (×2): qty 1, 30d supply, fill #0

## 2023-01-04 MED ORDER — TIRZEPATIDE-WEIGHT MANAGEMENT 5 MG/0.5ML ~~LOC~~ SOLN: 5.0000 mg | SUBCUTANEOUS | 1 refills | Status: DC

## 2023-01-04 NOTE — Assessment & Plan Note (Signed)
Zepbound vials ordered for self pay from [facility] due to no insurance coverage. Will follow up in 4 weeks with CMP and assess adherence, tolerance, and weight loss.

## 2023-01-04 NOTE — Assessment & Plan Note (Addendum)
Patient BP remains uncontrolled today, she is not monitoring at home and reports she has only taken her Lisinopril a few times. Counseled on importance of medication compliance and lifestyle modifications. Recommend heart healthy diet such as Mediterranean diet with whole grains, fruits, vegetable, fish, lean meats, nuts, and olive oil. Limit salt. Encouraged moderate walking, 3-5 times/week for 30-50 minutes each session. Aim for at least 150 minutes.week. Goal should be pace of 3 miles/hours, or walking 1.5 miles in 30 minutes. Avoid tobacco products. Avoid excess alcohol. Take medications as prescribed and bring medications and blood pressure log with cuff to each office visit. Seek medical care for chest pain, palpitations, shortness of breath with exertion, dizziness/lightheadedness, vision changes, recurrent headaches, or swelling of extremities. Return to office in 1 month.

## 2023-01-04 NOTE — Progress Notes (Signed)
Subjective:  HPI: Carla [name] is a 42 y.o. female presenting on 01/04/2023 for Follow-up (4 wk f/u ) and Hypertension   Hypertension   Patient is in today for blood pressure follow up. She was started on Lisinopril 10mg  daily on 12/06/2022.   HYPERTENSION without Chronic Kidney Disease Hypertension status: uncontrolled  Satisfied with current treatment? yes Duration of hypertension: months BP monitoring frequency:  not checking BP range:  BP medication side effects:  no Medication compliance: poor compliance Previous BP meds:lisinopril Aspirin: no Recurrent headaches: no Visual changes: no Palpitations: no Dyspnea: no Chest pain: no Lower extremity edema: no Dizzy/lightheaded: no   Review of Systems  All other systems reviewed and are negative.   Relevant past medical history reviewed and updated as indicated.   Past Medical History:  Diagnosis Date   Allergy    Asthma    Fibroids    Obesity    Pregnancy induced hypertension      Past Surgical History:  Procedure Laterality Date   CERVICAL CERCLAGE  2003, 2008   CERVICAL CERCLAGE N/A 04/12/2016   Procedure: CERCLAGE CERVICAL;  Surgeon: Carrington Clamp, MD;  Location: [facility];  Service: Gynecology;  Laterality: N/A;   CERVICAL CERCLAGE N/A 09/23/2016   Procedure: removal CERCLAGE CERVICAL;  Surgeon: Essie Hart, MD;  Location: [facility] BIRTHING SUITES;  Service: Obstetrics;  Laterality: N/A;   CESAREAN SECTION N/A 09/23/2016   Procedure: CESAREAN SECTION;  Surgeon: Essie Hart, MD;  Location: [facility] BIRTHING SUITES;  Service: Obstetrics;  Laterality: N/A;  RNFA  Tracey T   DILATION AND CURETTAGE OF UTERUS     ectopic     MYOMECTOMY  2016   fibroid removal    Allergies and medications reviewed and updated.   Current Outpatient Medications:    albuterol (VENTOLIN HFA) 108 (90 Base) MCG/ACT inhaler, Inhale 2 puffs into the lungs every 4 (four) hours as needed for wheezing or shortness of breath (before exercise).,  Disp: 18 g, Rfl: 2   Blood Pressure Monitoring KIT, 1 kit by Does not apply route once for 1 dose., Disp: 1 kit, Rfl: 0   cetirizine (ZYRTEC) 10 MG tablet, Take 1 tablet (10 mg total) by mouth daily as needed for allergies., Disp: 30 tablet, Rfl: 11   cyclobenzaprine (FLEXERIL) 10 MG tablet, Take 1 tablet (10 mg total) by mouth 3 (three) times daily as needed for muscle spasms., Disp: 30 tablet, Rfl: 0   fluticasone (FLONASE) 50 MCG/ACT nasal spray, Place 1 spray into both nostrils daily., Disp: 16 g, Rfl: 6   lisinopril (ZESTRIL) 10 MG tablet, Take 1 tablet (10 mg total) by mouth daily., Disp: 90 tablet, Rfl: 3   Multiple Vitamin (MULTIVITAMIN WITH MINERALS) TABS tablet, Take 1 tablet by mouth daily., Disp: , Rfl:    tirzepatide (ZEPBOUND) 2.5 MG/0.5ML injection vial, Inject 2.5 mg into the skin once a week for 28 days., Disp: 2 mL, Rfl: 0   [START ON 02/08/2023] tirzepatide 5 MG/0.5ML injection vial, Inject 5 mg into the skin once a week., Disp: 2 mL, Rfl: 1   tiZANidine (ZANAFLEX) 4 MG tablet, tizanidine 4 mg tablet  TAKE 1 TABLET BY MOUTH EVERY 6 HOURS AS NEEDED FOR MUSCLE SPASM, Disp: , Rfl:   Allergies  Allergen Reactions   Penicillins Hives and Nausea And Vomiting    Has patient had a PCN reaction causing immediate rash, facial/tongue/throat swelling, SOB or lightheadedness with hypotension: No Has patient had a PCN reaction causing severe rash involving mucus membranes or  skin necrosis: Yes Has patient had a PCN reaction that required hospitalization No Has patient had a PCN reaction occurring within the last 10 years: No If all of the above answers are "NO", then may proceed with Cephalosporin use.    Ibuprofen Palpitations   Latex Rash    Objective:   BP (!) 148/112   Pulse 76   Temp 98.2 F (36.8 C) (Oral)   Ht 5\' 7"  (1.702 m)   Wt (!) 351 lb (159.2 kg)   LMP 12/04/2022 (Exact Date)   SpO2 96%   BMI 54.97 kg/m      01/04/2023    4:13 PM 01/04/2023    4:08 PM 12/06/2022     2:41 PM  Vitals with BMI  Height  5\' 7"    Weight  351 lbs   BMI  54.96   Systolic 148 160 161  Diastolic 112 115 096  Pulse  76      Physical Exam Vitals and nursing note reviewed.  Constitutional:      Appearance: Normal appearance. She is obese.  HENT:     Head: Normocephalic and atraumatic.  Cardiovascular:     Rate and Rhythm: Normal rate and regular rhythm.     Pulses: Normal pulses.     Heart sounds: Normal heart sounds.  Pulmonary:     Effort: Pulmonary effort is normal.     Breath sounds: Normal breath sounds.  Skin:    General: Skin is warm and dry.  Neurological:     General: No focal deficit present.     Mental Status: She is alert and oriented to person, place, and time. Mental status is at baseline.  Psychiatric:        Mood and Affect: Mood normal.        Behavior: Behavior normal.        Thought Content: Thought content normal.        Judgment: Judgment normal.     Assessment & Plan:  Morbid obesity with BMI of 50.0-59.9, adult [facility]) Assessment & Plan: Zepbound vials ordered for self pay from [facility] due to no insurance coverage. Will follow up in 4 weeks with CMP and assess adherence, tolerance, and weight loss.    Essential hypertension Assessment & Plan: Patient BP remains uncontrolled today, she is not monitoring at home and reports she has only taken her Lisinopril a few times. Counseled on importance of medication compliance and lifestyle modifications. Recommend heart healthy diet such as Mediterranean diet with whole grains, fruits, vegetable, fish, lean meats, nuts, and olive oil. Limit salt. Encouraged moderate walking, 3-5 times/week for 30-50 minutes each session. Aim for at least 150 minutes.week. Goal should be pace of 3 miles/hours, or walking 1.5 miles in 30 minutes. Avoid tobacco products. Avoid excess alcohol. Take medications as prescribed and bring medications and blood pressure log with cuff to each office visit. Seek medical  care for chest pain, palpitations, shortness of breath with exertion, dizziness/lightheadedness, vision changes, recurrent headaches, or swelling of extremities. Return to office in 1 month.    Other orders -     Tirzepatide-Weight Management; Inject 2.5 mg into the skin once a week for 28 days.  Dispense: 2 mL; Refill: 0 -     Tirzepatide-Weight Management; Inject 5 mg into the skin once a week.  Dispense: 2 mL; Refill: 1 -     Blood Pressure Monitoring; 1 kit by Does not apply route once for 1 dose.  Dispense: 1 kit; Refill: 0  Follow up plan: Return in about 4 weeks (around 02/01/2023) for hypertension, weight management.  Park Meo, FNP

## 2023-01-05 ENCOUNTER — Ambulatory Visit: Payer: BC Managed Care – PPO | Admitting: Family Medicine

## 2023-01-05 ENCOUNTER — Encounter: Payer: BC Managed Care – PPO | Admitting: Family Medicine

## 2023-01-05 ENCOUNTER — Other Ambulatory Visit (HOSPITAL_COMMUNITY): Payer: Self-pay

## 2023-01-05 ENCOUNTER — Other Ambulatory Visit: Payer: Self-pay

## 2023-01-05 DIAGNOSIS — E669 Obesity, unspecified: Secondary | ICD-10-CM

## 2023-01-05 MED ORDER — TIRZEPATIDE-WEIGHT MANAGEMENT 2.5 MG/0.5ML ~~LOC~~ SOLN: 2.5000 mg | SUBCUTANEOUS | 0 refills | Status: AC

## 2023-01-05 NOTE — Telephone Encounter (Signed)
01/04/23 NP send medication (Zepbound-vial) to [facility].  01/05/28, re-send order (due to first Rx did not have DX code) w/DX of E66.9

## 2023-01-13 ENCOUNTER — Other Ambulatory Visit (HOSPITAL_COMMUNITY): Payer: Self-pay

## 2023-01-16 ENCOUNTER — Other Ambulatory Visit: Payer: Self-pay | Admitting: Family Medicine

## 2023-02-02 ENCOUNTER — Ambulatory Visit (INDEPENDENT_AMBULATORY_CARE_PROVIDER_SITE_OTHER): Payer: 59 | Admitting: Family Medicine

## 2023-02-02 ENCOUNTER — Other Ambulatory Visit (HOSPITAL_COMMUNITY): Payer: Self-pay

## 2023-02-02 ENCOUNTER — Encounter: Payer: Self-pay | Admitting: Family Medicine

## 2023-02-02 VITALS — BP 150/100 | HR 71 | Temp 98.9°F | Ht 67.0 in | Wt 339.0 lb

## 2023-02-02 DIAGNOSIS — I10 Essential (primary) hypertension: Secondary | ICD-10-CM

## 2023-02-02 DIAGNOSIS — Z6841 Body Mass Index (BMI) 40.0 and over, adult: Secondary | ICD-10-CM | POA: Diagnosis not present

## 2023-02-02 DIAGNOSIS — J4520 Mild intermittent asthma, uncomplicated: Secondary | ICD-10-CM

## 2023-02-02 MED ORDER — ALBUTEROL SULFATE HFA 108 (90 BASE) MCG/ACT IN AERS
2.0000 | INHALATION_SPRAY | RESPIRATORY_TRACT | 2 refills | Status: DC | PRN
  Filled 2023-02-02: qty 6.7, 17d supply, fill #0
  Filled 2023-08-08: qty 6.7, 17d supply, fill #1

## 2023-02-02 MED ORDER — LOSARTAN POTASSIUM 50 MG PO TABS
50.0000 mg | ORAL_TABLET | Freq: Every day | ORAL | 1 refills | Status: DC
  Filled 2023-02-02: qty 30, 30d supply, fill #0

## 2023-02-02 NOTE — Assessment & Plan Note (Signed)
Followed by weight management clinic. Continue Tirzepatide 7.5mg  weekly.

## 2023-02-02 NOTE — Assessment & Plan Note (Signed)
Requested Albuterol refill. 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 Will notify office if needed more than 2x weekly or has 2x night wakings per month.

## 2023-02-02 NOTE — Progress Notes (Signed)
Subjective:  HPI: Carla [name] is a 42 y.o. female presenting on 02/02/2023 for Follow-up (Doing well overall. Following up on HTN and weight management )   HPI Patient is in today for blood pressure follow up and weight management follow up. She is established with a weight loss clinic and is currently taking 7.5mg  weekly Tirzepatide. She has lost 12 pounds. She is eating high protein diet and walking daily.  She has not been monitoring her blood pressure and home and is not taking her Lisinopril due to side effects including swelling.  HYPERTENSION without Chronic Kidney Disease Hypertension status: uncontrolled  Satisfied with current treatment? no Duration of hypertension: chronic BP monitoring frequency:  not checking BP range:  BP medication side effects:  yes Medication compliance: poor compliance Previous BP meds:amlodipine, HCTZ, and lisinopril Aspirin: no Recurrent headaches: no Visual changes: no Palpitations: no Dyspnea: no Chest pain: no Lower extremity edema: no Dizzy/lightheaded: no   Review of Systems  All other systems reviewed and are negative.   Relevant past medical history reviewed and updated as indicated.   Past Medical History:  Diagnosis Date   Allergy    Asthma    Fibroids    Obesity    Pregnancy induced hypertension      Past Surgical History:  Procedure Laterality Date   CERVICAL CERCLAGE  2003, 2008   CERVICAL CERCLAGE N/A 04/12/2016   Procedure: CERCLAGE CERVICAL;  Surgeon: Carrington Clamp, MD;  Location: [facility];  Service: Gynecology;  Laterality: N/A;   CERVICAL CERCLAGE N/A 09/23/2016   Procedure: removal CERCLAGE CERVICAL;  Surgeon: Essie Hart, MD;  Location: [facility] BIRTHING SUITES;  Service: Obstetrics;  Laterality: N/A;   CESAREAN SECTION N/A 09/23/2016   Procedure: CESAREAN SECTION;  Surgeon: Essie Hart, MD;  Location: [facility] BIRTHING SUITES;  Service: Obstetrics;  Laterality: N/A;  RNFA  Tracey T   DILATION AND CURETTAGE OF UTERUS      ectopic     MYOMECTOMY  2016   fibroid removal    Allergies and medications reviewed and updated.   Current Outpatient Medications:    cetirizine (ZYRTEC) 10 MG tablet, Take 1 tablet (10 mg total) by mouth daily as needed for allergies., Disp: 30 tablet, Rfl: 11   cyclobenzaprine (FLEXERIL) 10 MG tablet, Take 1 tablet (10 mg total) by mouth 3 (three) times daily as needed for muscle spasms., Disp: 30 tablet, Rfl: 0   losartan (COZAAR) 50 MG tablet, Take 1 tablet (50 mg total) by mouth daily., Disp: 30 tablet, Rfl: 1   Multiple Vitamin (MULTIVITAMIN WITH MINERALS) TABS tablet, Take 1 tablet by mouth daily., Disp: , Rfl:    tirzepatide (ZEPBOUND) 2.5 MG/0.5ML injection vial, Inject 2.5 mg into the skin once a week for 28 days., Disp: 2 mL, Rfl: 0   [START ON 02/08/2023] tirzepatide 5 MG/0.5ML injection vial, Inject 5 mg into the skin once a week., Disp: 2 mL, Rfl: 1   tiZANidine (ZANAFLEX) 4 MG tablet, tizanidine 4 mg tablet  TAKE 1 TABLET BY MOUTH EVERY 6 HOURS AS NEEDED FOR MUSCLE SPASM, Disp: , Rfl:    albuterol (VENTOLIN HFA) 108 (90 Base) MCG/ACT inhaler, Inhale 2 puffs into the lungs every 4 (four) hours as needed for wheezing or shortness of breath (before exercise)., Disp: 18 g, Rfl: 2   fluticasone (FLONASE) 50 MCG/ACT nasal spray, Place 1 spray into both nostrils daily. (Patient not taking: Reported on 02/02/2023), Disp: 16 g, Rfl: 6  Allergies  Allergen Reactions   Penicillins Hives  and Nausea And Vomiting    Has patient had a PCN reaction causing immediate rash, facial/tongue/throat swelling, SOB or lightheadedness with hypotension: No Has patient had a PCN reaction causing severe rash involving mucus membranes or skin necrosis: Yes Has patient had a PCN reaction that required hospitalization No Has patient had a PCN reaction occurring within the last 10 years: No If all of the above answers are "NO", then may proceed with Cephalosporin use.    Ibuprofen Palpitations   Latex  Rash    Objective:   BP (!) 150/100 (BP Location: Left Arm)   Pulse 71   Temp 98.9 F (37.2 C)   Ht 5\' 7"  (1.702 m)   Wt (!) 339 lb (153.8 kg)   SpO2 98%   BMI 53.09 kg/m      02/02/2023    4:07 PM 01/04/2023    4:13 PM 01/04/2023    4:08 PM  Vitals with BMI  Height 5\' 7"   5\' 7"   Weight 339 lbs  351 lbs  BMI 53.08  54.96  Systolic 150 148 563  Diastolic 100 112 875  Pulse 71  76     Physical Exam Vitals and nursing note reviewed.  Constitutional:      Appearance: Normal appearance. She is obese.  HENT:     Head: Normocephalic and atraumatic.  Cardiovascular:     Rate and Rhythm: Normal rate and regular rhythm.     Pulses: Normal pulses.     Heart sounds: Normal heart sounds.  Pulmonary:     Effort: Pulmonary effort is normal.     Breath sounds: Normal breath sounds.  Skin:    General: Skin is warm and dry.  Neurological:     General: No focal deficit present.     Mental Status: She is alert and oriented to person, place, and time. Mental status is at baseline.  Psychiatric:        Mood and Affect: Mood normal.        Behavior: Behavior normal.        Thought Content: Thought content normal.        Judgment: Judgment normal.     Assessment & Plan:  Morbid obesity with BMI of 50.0-59.9, adult [facility]) Assessment & Plan: Followed by weight management clinic. Continue Tirzepatide 7.5mg  weekly.   Essential hypertension Assessment & Plan: Patient BP remains uncontrolled today, she is not monitoring at home and reports she has not been taking her Lisinopril. Will start Losartan 50mg  daily. Recommend heart healthy diet such as Mediterranean diet with whole grains, fruits, vegetable, fish, lean meats, nuts, and olive oil. Limit salt. Encouraged moderate walking, 3-5 times/week for 30-50 minutes each session. Aim for at least 150 minutes.week. Goal should be pace of 3 miles/hours, or walking 1.5 miles in 30 minutes. Avoid tobacco products. Avoid excess alcohol. Take  medications as prescribed and bring medications and blood pressure log with cuff to each office visit. Seek medical care for chest pain, palpitations, shortness of breath with exertion, dizziness/lightheadedness, vision changes, recurrent headaches, or swelling of extremities. Return to office in 2-4 weeks.    Mild intermittent asthma without complication Assessment & Plan: Requested Albuterol refill. 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 Will notify office if needed more than 2x weekly or has 2x night wakings per month.  Orders: -     Albuterol Sulfate HFA; Inhale 2 puffs into the lungs every 4 (four) hours as needed for wheezing or shortness of breath (before exercise).  Dispense: 18 g; Refill: 2  Other orders -     Losartan Potassium; Take 1 tablet (50 mg total) by mouth daily.  Dispense: 30 tablet; Refill: 1     Follow up plan: Return in about 4 weeks (around 03/02/2023).  Park Meo, FNP

## 2023-02-02 NOTE — Assessment & Plan Note (Signed)
Patient BP remains uncontrolled today, she is not monitoring at home and reports she has not been taking her Lisinopril. Will start Losartan 50mg  daily. Recommend heart healthy diet such as Mediterranean diet with whole grains, fruits, vegetable, fish, lean meats, nuts, and olive oil. Limit salt. Encouraged moderate walking, 3-5 times/week for 30-50 minutes each session. Aim for at least 150 minutes.week. Goal should be pace of 3 miles/hours, or walking 1.5 miles in 30 minutes. Avoid tobacco products. Avoid excess alcohol. Take medications as prescribed and bring medications and blood pressure log with cuff to each office visit. Seek medical care for chest pain, palpitations, shortness of breath with exertion, dizziness/lightheadedness, vision changes, recurrent headaches, or swelling of extremities. Return to office in 2-4 weeks.

## 2023-02-03 ENCOUNTER — Other Ambulatory Visit (HOSPITAL_COMMUNITY): Payer: Self-pay

## 2023-03-02 ENCOUNTER — Ambulatory Visit: Payer: BC Managed Care – PPO | Admitting: Family Medicine

## 2023-03-06 ENCOUNTER — Ambulatory Visit (INDEPENDENT_AMBULATORY_CARE_PROVIDER_SITE_OTHER): Payer: 59 | Admitting: Family Medicine

## 2023-03-06 ENCOUNTER — Telehealth: Payer: Self-pay | Admitting: Family Medicine

## 2023-03-06 ENCOUNTER — Encounter: Payer: Self-pay | Admitting: Family Medicine

## 2023-03-06 VITALS — BP 158/100 | HR 91 | Temp 98.0°F | Ht 67.0 in | Wt 332.0 lb

## 2023-03-06 DIAGNOSIS — I10 Essential (primary) hypertension: Secondary | ICD-10-CM | POA: Diagnosis not present

## 2023-03-06 NOTE — Telephone Encounter (Signed)
Copied from CRM [id number]. Topic: Clinical - Home Health Verbal Orders >> Mar 06, 2023 10:59 AM Almira Coaster wrote: Caller/Agency: [facility] Callback Number: [phone] Service Requested: Medication refill request faxed to the clinic for semaglutide injection  Frequency:  Any new concerns about the patient? No

## 2023-03-06 NOTE — Assessment & Plan Note (Signed)
Patient BP remains uncontrolled today, she is monitoring a few times a week at home and BP remains elevated. She is non-compliant with medications and plan of care as discussed during recent OVs, she is very reluctant to take medications due to history of side effects and fear of feeling bad. We had a lengthy discussion today regarding the risks of uncontrolled blood pressure and s/s of hypertension, stroke, MI, and when to seek emergent medical care. She would like try Losartan and will start this today.  Recommend heart healthy diet such as Mediterranean diet with whole grains, fruits, vegetable, fish, lean meats, nuts, and olive oil. Limit salt. Encouraged moderate walking, 3-5 times/week for 30-50 minutes each session. Aim for at least 150 minutes.week. Goal should be pace of 3 miles/hours, or walking 1.5 miles in 30 minutes. Avoid tobacco products. Avoid excess alcohol. Take medications as prescribed and bring medications and blood pressure log with cuff to each office visit. Seek medical care for chest pain, palpitations, shortness of breath with exertion, dizziness/lightheadedness, vision changes, recurrent headaches, or swelling of extremities. Follow up in 1 month or sooner if needed.

## 2023-03-06 NOTE — Progress Notes (Signed)
Subjective:  HPI: Carla [name] is a 42 y.o. female presenting on 03/06/2023 for No chief complaint on file.   HPI Patient is in today for HTN follow-up. Home readings have been 150/94, 160/100, 136/89, 161/100, 166/105, 153/98, 153/100, 176/102, 163/103, 162/107, 150/94, 156/92, 150/89, 178/112... She has not started taking Losartan due to fears of side effects. She has had swelling and cough in past with Lisinopril, Amlodipine, and hydrochlorothiazide. She would like to try lifestyle modifications instead.  HYPERTENSION without Chronic Kidney Disease Hypertension status: uncontrolled  Satisfied with current treatment? no Duration of hypertension: chronic BP monitoring frequency:  a few times a week BP range: 136/89 - 176/102 BP medication side effects:  yes Medication compliance: poor compliance Previous BP meds:amlodipine, HCTZ, and lisinopril Aspirin: no Recurrent headaches: no Visual changes: no Palpitations: no Dyspnea: no Chest pain: no Lower extremity edema: no Dizzy/lightheaded: no   Review of Systems  All other systems reviewed and are negative.   Relevant past medical history reviewed and updated as indicated.   Past Medical History:  Diagnosis Date   Allergy    Asthma    Fibroids    Obesity    Pregnancy induced hypertension      Past Surgical History:  Procedure Laterality Date   CERVICAL CERCLAGE  2003, 2008   CERVICAL CERCLAGE N/A 04/12/2016   Procedure: CERCLAGE CERVICAL;  Surgeon: Carrington Clamp, MD;  Location: [facility];  Service: Gynecology;  Laterality: N/A;   CERVICAL CERCLAGE N/A 09/23/2016   Procedure: removal CERCLAGE CERVICAL;  Surgeon: Essie Hart, MD;  Location: [facility] BIRTHING SUITES;  Service: Obstetrics;  Laterality: N/A;   CESAREAN SECTION N/A 09/23/2016   Procedure: CESAREAN SECTION;  Surgeon: Essie Hart, MD;  Location: [facility] BIRTHING SUITES;  Service: Obstetrics;  Laterality: N/A;  RNFA  Tracey T   DILATION AND CURETTAGE OF UTERUS      ectopic     MYOMECTOMY  2016   fibroid removal    Allergies and medications reviewed and updated.   Current Outpatient Medications:    albuterol (VENTOLIN HFA) 108 (90 Base) MCG/ACT inhaler, Inhale 2 puffs into the lungs every 4 (four) hours as needed for wheezing or shortness of breath (before exercise)., Disp: 18 g, Rfl: 2   cetirizine (ZYRTEC) 10 MG tablet, Take 1 tablet (10 mg total) by mouth daily as needed for allergies., Disp: 30 tablet, Rfl: 11   cyclobenzaprine (FLEXERIL) 10 MG tablet, Take 1 tablet (10 mg total) by mouth 3 (three) times daily as needed for muscle spasms., Disp: 30 tablet, Rfl: 0   fluticasone (FLONASE) 50 MCG/ACT nasal spray, Place 1 spray into both nostrils daily., Disp: 16 g, Rfl: 6   losartan (COZAAR) 50 MG tablet, Take 1 tablet (50 mg total) by mouth daily., Disp: 30 tablet, Rfl: 1   Multiple Vitamin (MULTIVITAMIN WITH MINERALS) TABS tablet, Take 1 tablet by mouth daily., Disp: , Rfl:    tirzepatide 5 MG/0.5ML injection vial, Inject 5 mg into the skin once a week. (Patient taking differently: Inject 7.5 mg into the skin once a week.), Disp: 2 mL, Rfl: 1   tiZANidine (ZANAFLEX) 4 MG tablet, tizanidine 4 mg tablet  TAKE 1 TABLET BY MOUTH EVERY 6 HOURS AS NEEDED FOR MUSCLE SPASM, Disp: , Rfl:   Allergies  Allergen Reactions   Penicillins Hives and Nausea And Vomiting    Has patient had a PCN reaction causing immediate rash, facial/tongue/throat swelling, SOB or lightheadedness with hypotension: No Has patient had a PCN reaction causing severe  rash involving mucus membranes or skin necrosis: Yes Has patient had a PCN reaction that required hospitalization No Has patient had a PCN reaction occurring within the last 10 years: No If all of the above answers are "NO", then may proceed with Cephalosporin use.    Ibuprofen Palpitations   Latex Rash    Objective:   BP (!) 158/100   Pulse 91   Temp 98 F (36.7 C) (Oral)   Ht 5\' 7"  (1.702 m)   Wt (!) 332 lb  (150.6 kg)   LMP 02/20/2023 (Approximate)   SpO2 96%   BMI 52.00 kg/m      03/06/2023    8:22 AM 02/02/2023    4:07 PM 01/04/2023    4:13 PM  Vitals with BMI  Height 5\' 7"  5\' 7"    Weight 332 lbs 339 lbs   BMI 51.99 53.08   Systolic 158 150 161  Diastolic 100 100 096  Pulse 91 71      Physical Exam Vitals and nursing note reviewed.  Constitutional:      Appearance: Normal appearance. She is obese.  HENT:     Head: Normocephalic and atraumatic.  Cardiovascular:     Rate and Rhythm: Normal rate and regular rhythm.     Pulses: Normal pulses.     Heart sounds: Normal heart sounds.  Pulmonary:     Effort: Pulmonary effort is normal.     Breath sounds: Normal breath sounds.  Skin:    General: Skin is warm and dry.  Neurological:     General: No focal deficit present.     Mental Status: She is alert and oriented to person, place, and time. Mental status is at baseline.  Psychiatric:        Mood and Affect: Mood normal.        Behavior: Behavior normal.        Thought Content: Thought content normal.        Judgment: Judgment normal.     Assessment & Plan:  Essential hypertension Assessment & Plan: Patient BP remains uncontrolled today, she is monitoring a few times a week at home and BP remains elevated. She is non-compliant with medications and plan of care as discussed during recent OVs, she is very reluctant to take medications due to history of side effects and fear of feeling bad. We had a lengthy discussion today regarding the risks of uncontrolled blood pressure and s/s of hypertension, stroke, MI, and when to seek emergent medical care. She would like try Losartan and will start this today.  Recommend heart healthy diet such as Mediterranean diet with whole grains, fruits, vegetable, fish, lean meats, nuts, and olive oil. Limit salt. Encouraged moderate walking, 3-5 times/week for 30-50 minutes each session. Aim for at least 150 minutes.week. Goal should be pace of 3  miles/hours, or walking 1.5 miles in 30 minutes. Avoid tobacco products. Avoid excess alcohol. Take medications as prescribed and bring medications and blood pressure log with cuff to each office visit. Seek medical care for chest pain, palpitations, shortness of breath with exertion, dizziness/lightheadedness, vision changes, recurrent headaches, or swelling of extremities. Follow up in 1 month or sooner if needed.      Follow up plan: Return for annual physical with labs 1 week prior.  Park Meo, FNP

## 2023-03-07 MED ORDER — TIRZEPATIDE 10 MG/0.5ML ~~LOC~~ SOAJ: 10.0000 mg | SUBCUTANEOUS | 1 refills | Status: DC

## 2023-03-07 NOTE — Telephone Encounter (Signed)
Spoke w/pt re tirzepatide: Per pt to send meds to [facility], for 10mg  pen. Pt voiced understanding that she may have to pay out of pocket.

## 2023-03-08 ENCOUNTER — Encounter: Payer: Self-pay | Admitting: Family Medicine

## 2023-03-24 ENCOUNTER — Telehealth: Payer: Self-pay

## 2023-03-24 NOTE — Telephone Encounter (Signed)
PA-Mounjaro has been sent to plan

## 2023-03-24 NOTE — Telephone Encounter (Signed)
PA-Mounjaro has been denied per insurance.

## 2023-04-06 ENCOUNTER — Other Ambulatory Visit: Payer: BC Managed Care – PPO

## 2023-04-06 DIAGNOSIS — Z13220 Encounter for screening for lipoid disorders: Secondary | ICD-10-CM

## 2023-04-06 DIAGNOSIS — I10 Essential (primary) hypertension: Secondary | ICD-10-CM

## 2023-04-06 DIAGNOSIS — E559 Vitamin D deficiency, unspecified: Secondary | ICD-10-CM

## 2023-04-07 LAB — COMPLETE METABOLIC PANEL WITH GFR
AG Ratio: 1.5 (calc) (ref 1.0–2.5)
ALT: 17 U/L (ref 6–29)
AST: 18 U/L (ref 10–30)
Albumin: 4.6 g/dL (ref 3.6–5.1)
Alkaline phosphatase (APISO): 61 U/L (ref 31–125)
BUN: 18 mg/dL (ref 7–25)
CO2: 25 mmol/L (ref 20–32)
Calcium: 9.9 mg/dL (ref 8.6–10.2)
Chloride: 104 mmol/L (ref 98–110)
Creat: 0.93 mg/dL (ref 0.50–0.99)
Globulin: 3 g/dL (ref 1.9–3.7)
Glucose, Bld: 91 mg/dL (ref 65–99)
Potassium: 4.8 mmol/L (ref 3.5–5.3)
Sodium: 138 mmol/L (ref 135–146)
Total Bilirubin: 0.3 mg/dL (ref 0.2–1.2)
Total Protein: 7.6 g/dL (ref 6.1–8.1)
eGFR: 79 mL/min/{1.73_m2} (ref >=60)

## 2023-04-07 LAB — CBC WITH DIFFERENTIAL/PLATELET
Absolute Lymphocytes: 1569 {cells}/uL (ref 850–3900)
Absolute Monocytes: 408 {cells}/uL (ref 200–950)
Basophils Absolute: 69 {cells}/uL (ref 0–200)
Basophils Relative: 1.3 %
Eosinophils Absolute: 191 {cells}/uL (ref 15–500)
Eosinophils Relative: 3.6 %
HCT: 40.3 % (ref 35.0–45.0)
Hemoglobin: 13.2 g/dL (ref 11.7–15.5)
MCH: 30.3 pg (ref 27.0–33.0)
MCHC: 32.8 g/dL (ref 32.0–36.0)
MCV: 92.4 fL (ref 80.0–100.0)
MPV: 10.6 fL (ref 7.5–12.5)
Monocytes Relative: 7.7 %
Neutro Abs: 3063 {cells}/uL (ref 1500–7800)
Neutrophils Relative %: 57.8 %
Platelets: 359 10*3/uL (ref 140–400)
RBC: 4.36 10*6/uL (ref 3.80–5.10)
RDW: 13.7 % (ref 11.0–15.0)
Total Lymphocyte: 29.6 %
WBC: 5.3 10*3/uL (ref 3.8–10.8)

## 2023-04-07 LAB — LIPID PANEL
Cholesterol: 223 mg/dL — ABNORMAL HIGH (ref <200)
HDL: 85 mg/dL (ref >=50)
LDL Cholesterol (Calc): 118 mg/dL — ABNORMAL HIGH
Non-HDL Cholesterol (Calc): 138 mg/dL — ABNORMAL HIGH (ref <130)
Total CHOL/HDL Ratio: 2.6 (calc) (ref <5.0)
Triglycerides: 102 mg/dL (ref <150)

## 2023-04-07 LAB — VITAMIN D 25 HYDROXY (VIT D DEFICIENCY, FRACTURES): Vit D, 25-Hydroxy: 14 ng/mL — ABNORMAL LOW (ref 30–100)

## 2023-04-11 ENCOUNTER — Ambulatory Visit (INDEPENDENT_AMBULATORY_CARE_PROVIDER_SITE_OTHER): Payer: 59 | Admitting: Family Medicine

## 2023-04-11 ENCOUNTER — Encounter: Payer: Self-pay | Admitting: Family Medicine

## 2023-04-11 VITALS — BP 152/90 | HR 84 | Temp 98.9°F | Ht 67.0 in | Wt 326.0 lb

## 2023-04-11 DIAGNOSIS — I10 Essential (primary) hypertension: Secondary | ICD-10-CM

## 2023-04-11 DIAGNOSIS — Z0001 Encounter for general adult medical examination with abnormal findings: Secondary | ICD-10-CM

## 2023-04-11 DIAGNOSIS — Z6841 Body Mass Index (BMI) 40.0 and over, adult: Secondary | ICD-10-CM

## 2023-04-11 DIAGNOSIS — E559 Vitamin D deficiency, unspecified: Secondary | ICD-10-CM | POA: Insufficient documentation

## 2023-04-11 DIAGNOSIS — E782 Mixed hyperlipidemia: Secondary | ICD-10-CM

## 2023-04-11 DIAGNOSIS — Z0000 Encounter for general adult medical examination without abnormal findings: Secondary | ICD-10-CM | POA: Insufficient documentation

## 2023-04-11 MED ORDER — LOSARTAN POTASSIUM 50 MG PO TABS: 100.0000 mg | ORAL_TABLET | Freq: Every day | ORAL | 1 refills | Status: DC

## 2023-04-11 MED ORDER — VITAMIN D 25 MCG (1000 UNIT) PO TABS: 1000.0000 [IU] | ORAL_TABLET | Freq: Every day | ORAL | 0 refills | Status: AC

## 2023-04-11 NOTE — Progress Notes (Signed)
 Complete physical exam  Patient: Carla [name]   DOB: October 24, 1980   43 y.o. Female  MRN: [id number]  Subjective:    Chief Complaint  Patient presents with   Annual Exam    No pap    Carla [name] is a 43 y.o. female who presents today for a complete physical exam. She reports consuming a  reduced calorie 1000-1200 calorie, high protein  diet. Home exercise routine includes walking 0.5 hrs per days. She generally feels well. She reports sleeping well. She does not have additional problems to discuss today.   HYPERTENSION without Chronic Kidney Disease Hypertension status: uncontrolled  Satisfied with current treatment? yes Duration of hypertension: chronic BP monitoring frequency:  a few times a week BP range: avg 159/100 BP medication side effects:  no Medication compliance: poor compliance taking 0.5-1 tablet 4-7 days per week Previous BP meds:amlodipine, HCTZ, lisinopril , and losartan  (cozaar ) Aspirin: no Recurrent headaches: no Visual changes: no Palpitations: no Dyspnea: no Chest pain: no Lower extremity edema: no Dizzy/lightheaded: no  The 10-year ASCVD risk score (Arnett DK, et al., 2019) is: 1.7%   Values used to calculate the score:     Age: 42 years     Sex: Female     Is Non-Hispanic African American: Yes     Diabetic: No     Tobacco smoker: No     Systolic Blood Pressure: 152 mmHg     Is BP treated: Yes     HDL Cholesterol: 85 mg/dL     Total Cholesterol: 223 mg/dL   Most recent fall risk assessment:    02/02/2023    4:12 PM  Fall Risk   Falls in the past year? 0  Number falls in past yr: 0  Injury with Fall? 0     Most recent depression screenings:    04/11/2023   10:57 AM 03/06/2023    8:35 AM  PHQ 2/9 Scores  PHQ - 2 Score 0 0  PHQ- 9 Score 0 0    Dental: No current dental problems and Receives regular dental care  Patient Active Problem List   Diagnosis Date Noted   Physical exam, annual 04/11/2023   Vitamin D  insufficiency 04/11/2023    Morbid obesity with BMI of 50.0-59.9, adult (HCC) 12/06/2022   Encounter to establish care with new doctor 11/29/2022   Essential hypertension 11/15/2021   Abnormal auditory perception of right ear 06/11/2020   Temporomandibular jaw dysfunction 06/11/2020   Hyperlipidemia 12/25/2018   Pregnancy w/ hx of uterine myomectomy 08/26/2016   Obesity 06/02/2015   Environmental allergies 06/02/2015   Asthma    Past Medical History:  Diagnosis Date   Allergy    Asthma    Fibroids    Obesity    Pregnancy induced hypertension    Past Surgical History:  Procedure Laterality Date   CERVICAL CERCLAGE  2003, 2008   CERVICAL CERCLAGE N/A 04/12/2016   Procedure: CERCLAGE CERVICAL;  Surgeon: Rosaline Luna, MD;  Location: [facility];  Service: Gynecology;  Laterality: N/A;   CERVICAL CERCLAGE N/A 09/23/2016   Procedure: removal CERCLAGE CERVICAL;  Surgeon: Bettina Muskrat, MD;  Location: [facility] BIRTHING SUITES;  Service: Obstetrics;  Laterality: N/A;   CESAREAN SECTION N/A 09/23/2016   Procedure: CESAREAN SECTION;  Surgeon: Bettina Muskrat, MD;  Location: [facility] BIRTHING SUITES;  Service: Obstetrics;  Laterality: N/A;  RNFA  Tracey T   DILATION AND CURETTAGE OF UTERUS     ectopic     MYOMECTOMY  2016   fibroid  removal   Social History   Tobacco Use   Smoking status: Never   Smokeless tobacco: Never  Substance Use Topics   Alcohol use: Yes   Drug use: No   Family History  Problem Relation Age of Onset   Hypertension Mother    Alzheimer's disease Maternal Grandmother    Cancer Maternal Grandfather    Kidney disease Paternal Grandmother    Diabetes Paternal Grandmother    Cancer Paternal Grandfather    Asthma Brother    Heart disease Maternal Aunt    Learning disabilities Paternal Uncle    Cancer Paternal Uncle    Heart disease Paternal Uncle    Diabetes Paternal Uncle    Diabetes Maternal Uncle    Cancer Maternal Uncle    Diabetes Paternal Aunt    Allergies  Allergen Reactions   Penicillins  Hives and Nausea And Vomiting    Has patient had a PCN reaction causing immediate rash, facial/tongue/throat swelling, SOB or lightheadedness with hypotension: No Has patient had a PCN reaction causing severe rash involving mucus membranes or skin necrosis: Yes Has patient had a PCN reaction that required hospitalization No Has patient had a PCN reaction occurring within the last 10 years: No If all of the above answers are NO, then may proceed with Cephalosporin use.    Ibuprofen Palpitations   Latex Rash      Patient Care Team: Kayla Jeoffrey RAMAN, FNP as PCP - General (Family Medicine)   Outpatient Medications Prior to Visit  Medication Sig   albuterol  (VENTOLIN  HFA) 108 (90 Base) MCG/ACT inhaler Inhale 2 puffs into the lungs every 4 (four) hours as needed for wheezing or shortness of breath (before exercise).   cetirizine  (ZYRTEC ) 10 MG tablet Take 1 tablet (10 mg total) by mouth daily as needed for allergies.   cyclobenzaprine  (FLEXERIL ) 10 MG tablet Take 1 tablet (10 mg total) by mouth 3 (three) times daily as needed for muscle spasms.   fluticasone  (FLONASE ) 50 MCG/ACT nasal spray Place 1 spray into both nostrils daily.   Multiple Vitamin (MULTIVITAMIN WITH MINERALS) TABS tablet Take 1 tablet by mouth daily.   tirzepatide  (MOUNJARO ) 10 MG/0.5ML Pen Inject 10 mg into the skin once a week.   tiZANidine  (ZANAFLEX ) 4 MG tablet tizanidine  4 mg tablet  TAKE 1 TABLET BY MOUTH EVERY 6 HOURS AS NEEDED FOR MUSCLE SPASM   [DISCONTINUED] losartan  (COZAAR ) 50 MG tablet Take 1 tablet (50 mg total) by mouth daily.   No facility-administered medications prior to visit.    Review of Systems  Constitutional: Negative.   HENT: Negative.    Eyes: Negative.   Respiratory: Negative.    Cardiovascular: Negative.   Gastrointestinal: Negative.   Genitourinary: Negative.   Musculoskeletal: Negative.   Skin: Negative.   Neurological: Negative.   Endo/Heme/Allergies: Negative.    Psychiatric/Behavioral: Negative.    All other systems reviewed and are negative.         Objective:     BP (!) 152/90   Pulse 84   Temp 98.9 F (37.2 C) (Oral)   Ht 5' 7 (1.702 m)   Wt (!) 326 lb (147.9 kg)   LMP 03/16/2023 (Approximate)   SpO2 96%   BMI 51.06 kg/m  BP Readings from Last 3 Encounters:  04/11/23 (!) 152/90  03/06/23 (!) 158/100  02/02/23 (!) 150/100   Wt Readings from Last 3 Encounters:  04/11/23 (!) 326 lb (147.9 kg)  03/06/23 (!) 332 lb (150.6 kg)  02/02/23 (!) 339 lb (  153.8 kg)      Physical Exam Vitals and nursing note reviewed.  Constitutional:      Appearance: Normal appearance. She is obese.  HENT:     Head: Normocephalic and atraumatic.     Right Ear: Tympanic membrane, ear canal and external ear normal.     Left Ear: Tympanic membrane, ear canal and external ear normal.     Nose: Nose normal.     Mouth/Throat:     Mouth: Mucous membranes are moist.     Pharynx: Oropharynx is clear.  Eyes:     Extraocular Movements: Extraocular movements intact.     Conjunctiva/sclera: Conjunctivae normal.     Pupils: Pupils are equal, round, and reactive to light.  Cardiovascular:     Rate and Rhythm: Normal rate and regular rhythm.     Pulses: Normal pulses.     Heart sounds: Normal heart sounds.  Pulmonary:     Effort: Pulmonary effort is normal.     Breath sounds: Normal breath sounds.  Abdominal:     General: Bowel sounds are normal.     Palpations: Abdomen is soft.  Musculoskeletal:        General: Normal range of motion.     Cervical back: Normal range of motion and neck supple.  Skin:    General: Skin is warm and dry.     Capillary Refill: Capillary refill takes less than 2 seconds.  Neurological:     General: No focal deficit present.     Mental Status: She is alert and oriented to person, place, and time. Mental status is at baseline.  Psychiatric:        Mood and Affect: Mood normal.        Behavior: Behavior normal.         Thought Content: Thought content normal.        Judgment: Judgment normal.      No results found for any visits on 04/11/23. Last CBC Lab Results  Component Value Date   WBC 5.3 04/06/2023   HGB 13.2 04/06/2023   HCT 40.3 04/06/2023   MCV 92.4 04/06/2023   MCH 30.3 04/06/2023   RDW 13.7 04/06/2023   PLT 359 04/06/2023   Last metabolic panel Lab Results  Component Value Date   GLUCOSE 91 04/06/2023   NA 138 04/06/2023   K 4.8 04/06/2023   CL 104 04/06/2023   CO2 25 04/06/2023   BUN 18 04/06/2023   CREATININE 0.93 04/06/2023   EGFR 79 04/06/2023   CALCIUM 9.9 04/06/2023   PROT 7.6 04/06/2023   ALBUMIN  3.3 (L) 09/23/2016   BILITOT 0.3 04/06/2023   ALKPHOS 56 09/23/2016   AST 18 04/06/2023   ALT 17 04/06/2023   ANIONGAP 7 09/23/2016   Last lipids Lab Results  Component Value Date   CHOL 223 (H) 04/06/2023   HDL 85 04/06/2023   LDLCALC 118 (H) 04/06/2023   TRIG 102 04/06/2023   CHOLHDL 2.6 04/06/2023   Last hemoglobin A1c Lab Results  Component Value Date   HGBA1C 5.9 (H) 12/01/2022   Last thyroid functions Lab Results  Component Value Date   TSH 2.46 12/01/2022   Last vitamin D  Lab Results  Component Value Date   VD25OH 14 (L) 04/06/2023   Last vitamin B12 and Folate No results found for: VITAMINB12, FOLATE      Assessment & Plan:    Routine Health Maintenance and Physical Exam  Immunization History  Administered Date(s) Administered   HPV 9-valent  02/05/2019, 04/01/2019, 08/19/2019   PFIZER(Purple Top)SARS-COV-2 Vaccination 06/15/2019, 07/13/2019   Pfizer(Comirnaty)Fall Seasonal Vaccine 12 years and older 03/14/2020, 03/30/2022   Tdap 07/27/2016   Unspecified SARS-COV-2 Vaccination 03/30/2022    Health Maintenance  Topic Date Due   Pneumococcal Vaccine 54-52 Years old (1 of 2 - PCV) Never done   COVID-19 Vaccine (5 - 2024-25 season) 11/27/2022   INFLUENZA VACCINE  06/26/2023 (Originally 10/27/2022)   Cervical Cancer Screening (HPV/Pap  Cotest)  02/05/2024   DTaP/Tdap/Td (2 - Td or Tdap) 07/28/2026   HPV VACCINES  Completed   Hepatitis C Screening  Completed   HIV Screening  Completed    Discussed health benefits of physical activity, and encouraged her to engage in regular exercise appropriate for her age and condition.  Problem List Items Addressed This Visit     Hyperlipidemia   Discussed risks of elevated cholesterol. I recommend consuming a heart healthy diet such as Mediterranean diet or DASH diet with whole grains, fruits, vegetable, fish, lean meats, nuts, and olive oil. Limit sweets and processed foods. I also encourage moderate intensity exercise 150 minutes weekly. This is 3-5 times weekly for 30-50 minutes each session. Goal should be pace of 3 miles/hours, or walking 1.5 miles in 30 minutes. The 10-year ASCVD risk score (Arnett DK, et al., 2019) is: 1.7%       Relevant Medications   losartan  (COZAAR ) 50 MG tablet   Essential hypertension   BP remains elevated today in office as well as home readings. She reports having been taking 1/2 tablet losartan  50mg  daily for several weeks then increasing to the full dose and still only taking the full dose approximately 4 days weekly. Counseled on importance of medication compliance. Will increase Losartan  to 100mg  daily.  Recommend heart healthy diet such as Mediterranean diet with whole grains, fruits, vegetable, fish, lean meats, nuts, and olive oil. Limit salt. Encouraged moderate walking, 3-5 times/week for 30-50 minutes each session. Aim for at least 150 minutes.week. Goal should be pace of 3 miles/hours, or walking 1.5 miles in 30 minutes. Avoid tobacco products. Avoid excess alcohol. Take medications as prescribed and bring medications and blood pressure log with cuff to each office visit. Seek medical care for chest pain, palpitations, shortness of breath with exertion, dizziness/lightheadedness, vision changes, recurrent headaches, or swelling of  extremities. Follow up in 1 month or sooner if needed.      Relevant Medications   losartan  (COZAAR ) 50 MG tablet   Morbid obesity with BMI of 50.0-59.9, adult (HCC)   Followed by weight management clinic. Continue Tirzepatide  10mg  weekly.      Physical exam, annual - Primary   Today your medical history was reviewed and routine physical exam with labs was performed. Recommend 150 minutes of moderate intensity exercise weekly and consuming a well-balanced diet. Advised to stop smoking if a smoker, avoid smoking if a non-smoker, limit alcohol consumption to 1 drink per day for women and 2 drinks per day for men, and avoid illicit drug use. Counseled in mental health awareness and when to seek medical care. Vaccine maintenance discussed. Appropriate health maintenance items reviewed. Return to office in 1 year for annual physical exam.       RESOLVED: Vitamin D  deficiency   Vitamin D  insufficiency   Vitamin D  level 14, start vitamin D  1000 units daily. Repeat in 3 months.      Other Visit Diagnoses       Morbid obesity (HCC)   (Chronic)  Return in about 4 weeks (around 05/09/2023) for hypertension.     Jeoffrey GORMAN Barrio, FNP

## 2023-04-11 NOTE — Assessment & Plan Note (Signed)
 Vitamin D level 14, start vitamin D 1000 units daily. Repeat in 3 months.

## 2023-04-11 NOTE — Assessment & Plan Note (Signed)
 Followed by weight management clinic. Continue Tirzepatide 10mg  weekly.

## 2023-04-11 NOTE — Assessment & Plan Note (Addendum)

## 2023-04-11 NOTE — Assessment & Plan Note (Signed)
 Discussed risks of elevated cholesterol. I recommend consuming a heart healthy diet such as Mediterranean diet or DASH diet with whole grains, fruits, vegetable, fish, lean meats, nuts, and olive oil. Limit sweets and processed foods. I also encourage moderate intensity exercise 150 minutes weekly. This is 3-5 times weekly for 30-50 minutes each session. Goal should be pace of 3 miles/hours, or walking 1.5 miles in 30 minutes. The 10-year ASCVD risk score (Arnett DK, et al., 2019) is: 1.7%

## 2023-04-11 NOTE — Assessment & Plan Note (Signed)
 BP remains elevated today in office as well as home readings. She reports having been taking 1/2 tablet losartan  50mg  daily for several weeks then increasing to the full dose and still only taking the full dose approximately 4 days weekly. Counseled on importance of medication compliance. Will increase Losartan  to 100mg  daily.  Recommend heart healthy diet such as Mediterranean diet with whole grains, fruits, vegetable, fish, lean meats, nuts, and olive oil. Limit salt. Encouraged moderate walking, 3-5 times/week for 30-50 minutes each session. Aim for at least 150 minutes.week. Goal should be pace of 3 miles/hours, or walking 1.5 miles in 30 minutes. Avoid tobacco products. Avoid excess alcohol. Take medications as prescribed and bring medications and blood pressure log with cuff to each office visit. Seek medical care for chest pain, palpitations, shortness of breath with exertion, dizziness/lightheadedness, vision changes, recurrent headaches, or swelling of extremities. Follow up in 1 month or sooner if needed.

## 2023-04-21 ENCOUNTER — Encounter: Payer: Self-pay | Admitting: Podiatry

## 2023-04-21 ENCOUNTER — Ambulatory Visit (INDEPENDENT_AMBULATORY_CARE_PROVIDER_SITE_OTHER): Payer: 59 | Admitting: Podiatry

## 2023-04-21 VITALS — Ht 67.0 in

## 2023-04-21 DIAGNOSIS — Z79899 Other long term (current) drug therapy: Secondary | ICD-10-CM | POA: Diagnosis not present

## 2023-04-21 DIAGNOSIS — B351 Tinea unguium: Secondary | ICD-10-CM

## 2023-04-21 NOTE — Progress Notes (Unsigned)
Subjective:  Patient ID: Carla [name], female    DOB: 09-01-1980,  MRN: [id number]  Chief Complaint  Patient presents with   Nail Problem    She reports" I was having some white and yellow color on my toes but they have improved since last visit and the nails have been flaking off"    43 y.o. female presents with the above complaint.  Patient presents for follow-up of bilateral hallux nail fungus.  She states she has noticed that she is some of it coming back.  She was doing pretty good with it.  Denies any other acute complaints definitely has improved considerably.   Review of Systems: Negative except as noted in the HPI. Denies N/V/F/Ch.  Past Medical History:  Diagnosis Date   Allergy    Asthma    Fibroids    Obesity    Pregnancy induced hypertension     Current Outpatient Medications:    albuterol (VENTOLIN HFA) 108 (90 Base) MCG/ACT inhaler, Inhale 2 puffs into the lungs every 4 (four) hours as needed for wheezing or shortness of breath (before exercise)., Disp: 18 g, Rfl: 2   cetirizine (ZYRTEC) 10 MG tablet, Take 1 tablet (10 mg total) by mouth daily as needed for allergies., Disp: 30 tablet, Rfl: 11   cholecalciferol (VITAMIN D3) 25 MCG (1000 UNIT) tablet, Take 1 tablet (1,000 Units total) by mouth daily., Disp: 90 tablet, Rfl: 0   cyclobenzaprine (FLEXERIL) 10 MG tablet, Take 1 tablet (10 mg total) by mouth 3 (three) times daily as needed for muscle spasms., Disp: 30 tablet, Rfl: 0   fluticasone (FLONASE) 50 MCG/ACT nasal spray, Place 1 spray into both nostrils daily., Disp: 16 g, Rfl: 6   losartan (COZAAR) 50 MG tablet, Take 2 tablets (100 mg total) by mouth daily., Disp: 180 tablet, Rfl: 1   Multiple Vitamin (MULTIVITAMIN WITH MINERALS) TABS tablet, Take 1 tablet by mouth daily., Disp: , Rfl:    tirzepatide (MOUNJARO) 10 MG/0.5ML Pen, Inject 10 mg into the skin once a week., Disp: 2 mL, Rfl: 1   tiZANidine (ZANAFLEX) 4 MG tablet, tizanidine 4 mg tablet  TAKE 1 TABLET BY  MOUTH EVERY 6 HOURS AS NEEDED FOR MUSCLE SPASM, Disp: , Rfl:   Social History   Tobacco Use  Smoking Status Never  Smokeless Tobacco Never    Allergies  Allergen Reactions   Penicillins Hives and Nausea And Vomiting    Has patient had a PCN reaction causing immediate rash, facial/tongue/throat swelling, SOB or lightheadedness with hypotension: No Has patient had a PCN reaction causing severe rash involving mucus membranes or skin necrosis: Yes Has patient had a PCN reaction that required hospitalization No Has patient had a PCN reaction occurring within the last 10 years: No If all of the above answers are "NO", then may proceed with Cephalosporin use.    Latex Rash   Objective:  There were no vitals filed for this visit. Body mass index is 51.06 kg/m. Constitutional Well developed. Well nourished.  Vascular Dorsalis pedis pulses palpable bilaterally. Posterior tibial pulses palpable bilaterally. Capillary refill normal to all digits.  No cyanosis or clubbing noted. Pedal hair growth normal.  Neurologic Normal speech. Oriented to person, place, and time. Epicritic sensation to light touch grossly present bilaterally.  Dermatologic I some recurrence noted of bilateral hallux nail fungus. Skin within normal limits  Orthopedic: Normal joint ROM without pain or crepitus bilaterally. No visible deformities. No bony tenderness.   Radiographs: None Assessment:   No diagnosis  found.  Plan:  Patient was evaluated and treated and all questions answered.  Bilateral hallux onychomycosis -Clinically she may be having some recurrence of bilateral hallux onychomycosis.  Encouraged her to do some more laser sessions few more sessions should help with it.  She states understand will be scheduled for laser therapy   No follow-ups on file.

## 2023-04-24 ENCOUNTER — Ambulatory Visit: Payer: 59 | Admitting: Podiatrist

## 2023-04-24 DIAGNOSIS — B351 Tinea unguium: Secondary | ICD-10-CM

## 2023-04-24 NOTE — Progress Notes (Signed)
Patient presents today for a second round of  laser treatment-  on bilateral hallux nails-  she states she had clearing of the nails after her first round of 6 treatments and has recently noticed an increase in discoloration of the nails.   Diagnosed with mycotic nail infection by Dr. Allena Katz.   Toenail most affected bilateral hallux. All other systems are negative.  Nails were filed thin. Laser therapy was administered to bilateral hallux  toenails  and patient tolerated the treatment well. All safety precautions were in place.   In the past she took 6 weeks of lamisil and stopped due to upset stomach.  Dr. Allena Katz recommended a total of 4 laser treatments for round 2-  will see her every 4-6 weeks x 3 more treatments.

## 2023-05-09 ENCOUNTER — Ambulatory Visit: Payer: BC Managed Care – PPO | Admitting: Family Medicine

## 2023-05-15 ENCOUNTER — Encounter: Payer: Self-pay | Admitting: Family Medicine

## 2023-05-15 ENCOUNTER — Ambulatory Visit: Payer: BC Managed Care – PPO | Admitting: Family Medicine

## 2023-05-15 VITALS — BP 140/100 | HR 76 | Temp 98.8°F | Ht 67.0 in | Wt 322.0 lb

## 2023-05-15 DIAGNOSIS — I10 Essential (primary) hypertension: Secondary | ICD-10-CM | POA: Diagnosis not present

## 2023-05-15 MED ORDER — CHLORTHALIDONE 25 MG PO TABS
12.5000 mg | ORAL_TABLET | Freq: Every day | ORAL | 0 refills | Status: DC
Start: 2023-05-15 — End: 2023-06-07

## 2023-05-15 NOTE — Assessment & Plan Note (Addendum)
 BP remains elevated today in office as well as home readings. Reinforced importance of medication compliance. Will continue Losartan to 100mg  daily and start Chlorthalidone 12.5mg  daily. Has failed many medications due to presumed medication side effects. Have struggled with medication compliance as well. Will refer to HTN clinic as well. Recommend heart healthy diet such as Mediterranean diet with whole grains, fruits, vegetable, fish, lean meats, nuts, and olive oil. Limit salt. Encouraged moderate walking, 3-5 times/week for 30-50 minutes each session. Aim for at least 150 minutes.week. Goal should be pace of 3 miles/hours, or walking 1.5 miles in 30 minutes. Avoid tobacco products. Avoid excess alcohol. Take medications as prescribed and bring medications and blood pressure log with cuff to each office visit. Seek medical care for chest pain, palpitations, shortness of breath with exertion, dizziness/lightheadedness, vision changes, recurrent headaches, or swelling of extremities. Follow up in 2 weeks or sooner if needed.

## 2023-05-15 NOTE — Progress Notes (Signed)
 Subjective:  HPI: Carla [name] is a 43 y.o. female presenting on 05/15/2023 for Follow-up (F/u high bp)   HPI Patient is in today for blood pressure follow up. She was seen at urgent care on Feb 11 for nosebleed and hypertensive urgency. She reports she has been taking the prescribed Losartan 100mg  daily for a "couple weeks". Her BP has also been elevated at [facility] and home readings have been elevated. [name] has tried and not tolerated several different medications including Amlodipine, Lisinopril, and hydrochlorothiazide due to cough and swelling.  HYPERTENSION without Chronic Kidney Disease Hypertension status: uncontrolled  Satisfied with current treatment? yes Duration of hypertension: chronic BP monitoring frequency:  a few times a week BP range: >140/90, avg 164/104 BP medication side effects:  no Medication compliance: good compliance Previous BP meds:amlodipine, HCTZ, lisinopril, and losartan (cozaar) Aspirin: no Recurrent headaches: no Visual changes: no Palpitations: no Dyspnea: no Chest pain: no Lower extremity edema: no Dizzy/lightheaded: no   Review of Systems  All other systems reviewed and are negative.   Relevant past medical history reviewed and updated as indicated.   Past Medical History:  Diagnosis Date   Allergy    Asthma    Fibroids    Obesity    Pregnancy induced hypertension      Past Surgical History:  Procedure Laterality Date   CERVICAL CERCLAGE  2003, 2008   CERVICAL CERCLAGE N/A 04/12/2016   Procedure: CERCLAGE CERVICAL;  Surgeon: Carrington Clamp, MD;  Location: [facility];  Service: Gynecology;  Laterality: N/A;   CERVICAL CERCLAGE N/A 09/23/2016   Procedure: removal CERCLAGE CERVICAL;  Surgeon: Essie Hart, MD;  Location: [facility] BIRTHING SUITES;  Service: Obstetrics;  Laterality: N/A;   CESAREAN SECTION N/A 09/23/2016   Procedure: CESAREAN SECTION;  Surgeon: Essie Hart, MD;  [facility]THING SU[facility];  Service: Obstetrics;  Laterality:  N/A;  RNFA  Tracey T   DILATION AND CURETTAGE OF UTERUS     ectopic     MYOMECTOMY  2016   fibroid removal    Allergies and medications reviewed and updated.   Current Outpatient Medications:    albuterol (VENTOLIN HFA) 108 (90 Base) MCG/ACT inhaler, Inhale 2 puffs into the lungs every 4 (four) hours as needed for wheezing or shortness of breath (before exercise)., Disp: 18 g, Rfl: 2   cetirizine (ZYRTEC) 10 MG tablet, Take 1 tablet (10 mg total) by mouth daily as needed for allergies., Disp: 30 tablet, Rfl: 11   chlorthalidone (HYGROTON) 25 MG tablet, Take 0.5 tablets (12.5 mg total) by mouth daily., Disp: 30 each, Rfl: 0   cholecalciferol (VITAMIN D3) 25 MCG (1000 UNIT) tablet, Take 1 tablet (1,000 Units total) by mouth daily., Disp: 90 tablet, Rfl: 0   cyclobenzaprine (FLEXERIL) 10 MG tablet, Take 1 tablet (10 mg total) by mouth 3 (three) times daily as needed for muscle spasms., Disp: 30 tablet, Rfl: 0   fluticasone (FLONASE) 50 MCG/ACT nasal spray, Place 1 spray into both nostrils daily., Disp: 16 g, Rfl: 6   losartan (COZAAR) 50 MG tablet, Take 2 tablets (100 mg total) by mouth daily., Disp: 180 tablet, Rfl: 1   Multiple Vitamin (MULTIVITAMIN WITH MINERALS) TABS tablet, Take 1 tablet by mouth daily., Disp: , Rfl:    tirzepatide (MOUNJARO) 10 MG/0.5ML Pen, Inject 10 mg into the skin once a week., Disp: 2 mL, Rfl: 1   tiZANidine (ZANAFLEX) 4 MG tablet, tizanidine 4 mg tablet  TAKE 1 TABLET BY MOUTH EVERY 6 HOURS AS NEEDED  FOR MUSCLE SPASM, Disp: , Rfl:   Allergies  Allergen Reactions   Penicillins Hives and Nausea And Vomiting    Has patient had a PCN reaction causing immediate rash, facial/tongue/throat swelling, SOB or lightheadedness with hypotension: No Has patient had a PCN reaction causing severe rash involving mucus membranes or skin necrosis: Yes Has patient had a PCN reaction that required hospitalization No Has patient had a PCN reaction occurring within the last 10 years:  No If all of the above answers are "NO", then may proceed with Cephalosporin use.    Latex Rash    Objective:   BP (!) 140/100   Pulse 76   Temp 98.8 F (37.1 C) (Oral)   Ht 5\' 7"  (1.702 m)   Wt (!) 322 lb (146.1 kg)   LMP 05/09/2023 (Approximate)   SpO2 98%   BMI 50.43 kg/m      05/15/2023   11:15 AM 04/21/2023   12:27 PM 04/11/2023    8:41 AM  Vitals with BMI  Height 5\' 7"  5\' 7"    Weight 322 lbs    BMI 50.42    Systolic 140  152  Diastolic 100  90  Pulse 76       Physical Exam Vitals and nursing note reviewed.  Constitutional:      Appearance: Normal appearance. She is obese.  HENT:     Head: Normocephalic and atraumatic.  Cardiovascular:     Rate and Rhythm: Normal rate and regular rhythm.     Pulses: Normal pulses.     Heart sounds: Normal heart sounds.  Pulmonary:     Effort: Pulmonary effort is normal.     Breath sounds: Normal breath sounds.  Skin:    General: Skin is warm and dry.  Neurological:     General: No focal deficit present.     Mental Status: She is alert and oriented to person, place, and time. Mental status is at baseline.  Psychiatric:        Mood and Affect: Mood normal.        Behavior: Behavior normal.        Thought Content: Thought content normal.        Judgment: Judgment normal.     Assessment & Plan:  Essential hypertension Assessment & Plan: BP remains elevated today in office as well as home readings. Reinforced importance of medication compliance. Will continue Losartan to 100mg  daily and start Chlorthalidone 12.5mg  daily. Has failed many medications due to presumed medication side effects. Have struggled with medication compliance as well. Will refer to HTN clinic as well. Recommend heart healthy diet such as Mediterranean diet with whole grains, fruits, vegetable, fish, lean meats, nuts, and olive oil. Limit salt. Encouraged moderate walking, 3-5 times/week for 30-50 minutes each session. Aim for at least 150 minutes.week.  Goal should be pace of 3 miles/hours, or walking 1.5 miles in 30 minutes. Avoid tobacco products. Avoid excess alcohol. Take medications as prescribed and bring medications and blood pressure log with cuff to each office visit. Seek medical care for chest pain, palpitations, shortness of breath with exertion, dizziness/lightheadedness, vision changes, recurrent headaches, or swelling of extremities. Follow up in 2 weeks or sooner if needed.  Orders: -     [facility] referral to [facility]  Other orders -     Chlorthalidone; Take 0.5 tablets (12.5 mg total) by mouth daily.  Dispense: 30 each; Refill: 0     Follow up plan: Return in about 2 weeks (around  05/29/2023) for hypertension.  Park Meo, FNP

## 2023-05-18 ENCOUNTER — Ambulatory Visit (HOSPITAL_BASED_OUTPATIENT_CLINIC_OR_DEPARTMENT_OTHER): Payer: BC Managed Care – PPO | Admitting: Family

## 2023-05-18 ENCOUNTER — Encounter (HOSPITAL_BASED_OUTPATIENT_CLINIC_OR_DEPARTMENT_OTHER): Payer: Self-pay | Admitting: Family

## 2023-05-18 VITALS — BP 173/110 | HR 80 | Ht 67.0 in | Wt 323.1 lb

## 2023-05-18 DIAGNOSIS — E782 Mixed hyperlipidemia: Secondary | ICD-10-CM

## 2023-05-18 DIAGNOSIS — R400 Somnolence: Secondary | ICD-10-CM | POA: Diagnosis not present

## 2023-05-18 DIAGNOSIS — Z6841 Body Mass Index (BMI) 40.0 and over, adult: Secondary | ICD-10-CM

## 2023-05-18 DIAGNOSIS — I10 Essential (primary) hypertension: Secondary | ICD-10-CM | POA: Diagnosis not present

## 2023-05-18 MED ORDER — OLMESARTAN MEDOXOMIL 40 MG PO TABS: 40.0000 mg | ORAL_TABLET | Freq: Every day | ORAL | 2 refills | Status: DC

## 2023-05-18 NOTE — Patient Instructions (Addendum)
 Medication Instructions:  STOP Losartan  START Olmesartan 40mg  daily  START Chlorthalidone half tablet (12.5mg ) daily   Labwork: Your physician recommends that you return for lab work 7-10 days after starting Olmesartan for morning fasting labs for cortisol, renin-aldosterone, BMP   Testing/Procedures: Your physician has requested that you have a renal artery duplex. During this test, an ultrasound is used to evaluate blood flow to the kidneys. Allow one hour for this exam. Do not eat after midnight the day before and avoid carbonated beverages. Take your medications as you usually do.   WatchPAT?  Is a FDA cleared portable home sleep study test that uses a watch and 3 points of contact to monitor 7 different channels, including your heart rate, oxygen saturations, body position, snoring, and chest motion.  The study is easy to use from the comfort of your own home and accurately detect sleep apnea.  Before bed, you attach the chest sensor, attached the sleep apnea bracelet to your nondominant hand, and attach the finger probe.  After the study, the raw data is downloaded from the watch and scored for apnea events.   For more information: [email or web address]  Patient Testing Instructions:  Do not put battery into the device until bedtime when you are ready to begin the test. Please call the support number if you need assistance after following the instructions below: 24 hour support line- [phone] or [facility] support at [phone] (option 2)  Download the IntelWatchPAT One" app through the google play store or App Store  Be sure to turn on or enable access to bluetooth in settlings on your smartphone/ device  Make sure no other bluetooth devices are on and within the vicinity of your smartphone/ device and WatchPAT watch during testing.  Make sure to leave your smart phone/ device plugged in and charging all night.  When ready for bed:  Follow the instructions  step by step in the WatchPAT One App to activate the testing device. For additional instructions, including video instruction, visit the WatchPAT One video on Youtube. You can search for WatchPat One within Youtube (video is 4 minutes and 18 seconds) or enter: https://youtube/watch?[email or web address]_vbiwxE Please note: You will be prompted to enter a Pin to connect via bluetooth when starting the test. The PIN will be assigned to you when you receive the test.  The device is disposable, but it recommended that you retain the device until you receive a call letting you know the study has been received and the results have been interpreted.  We will let you know if the study did not transmit to [place] properly after the test is completed. You do not need to call us to confirm the receipt of the test.  Please complete the test within 48 hours of receiving PIN.   Frequently Asked Questions:  What is Watch Dennie Bible one?  A single use fully disposable home sleep apnea testing device and will not need to be returned after completion.  What are the requirements to use WatchPAT one?  The be able to have a successful watchpat one sleep study, you should have your Watch pat one device, your smart phone, watch pat one app, your PIN number and Internet access What type of phone do I need?  You should have a smart phone that uses Android 5.1 and above or any Iphone with IOS 10 and above How can I download the WatchPAT one app?  Based on your device type search for WatchPAT one app either in google play  for android devices or APP store for Iphone's Where will I get my PIN for the study?  Your PIN will be provided by your physician's office. It is used for authentication and if you lose/forget your PIN, please reach out to your providers office.  I do not have Internet at home. Can I do WatchPAT one study?  WatchPAT One needs Internet connection throughout the night to be able to transmit the sleep data. You can use your home/local  internet or your cellular's data package. However, it is always recommended to use home/local Internet. It is estimated that between 20MB-30MB will be used with each study.However, the application will be looking for space in the phone to start the study.  What happens if I lose internet or bluetooth connection?  During the internet disconnection, your phone will not be able to transmit the sleep data. All the data, will be stored in your phone. As soon as the internet connection is back on, the phone will being sending the sleep data. During the bluetooth disconnection, WatchPAT one will not be able to to send the sleep data to your phone. Data will be kept in the [facility] one until two devices have bluetooth connection back on. As soon as the connection is back on, WatchPAT one will send the sleep data to the phone.  How long do I need to wear the WatchPAT one?  After you start the study, you should wear the device at least 6 hours.  How far should I keep my phone from the device?  During the night, your phone should be within 15 feet.  What happens if I leave the room for restroom or other reasons?  Leaving the room for any reason will not cause any problem. As soon as your get back to the room, both devices will reconnect and will continue to send the sleep data. Can I use my phone during the sleep study?  Yes, you can use your phone as usual during the study. But it is recommended to put your watchpat one on when you are ready to go to bed.  How will I get my study results?  A soon as you completed your study, your sleep data will be sent to the provider. They will then share the results with you when they are ready.     Follow-Up: In 6 weeks with [facility] (Dr. Duke Salvia, Alver Sorrow, NP or Phillips Hay, [facility])

## 2023-05-18 NOTE — Progress Notes (Signed)
 [facility] Initial Assessment:    Date:  05/18/2023   ID:  Carla [name], DOB [birth year], MRN [id number]  PCP:  Park Meo, FNP  Cardiologist:  None  Nephrologist:  Referring MD: Park Meo, FNP   CC: Hypertension  History of Present Illness:    Carla [name] is a 43 y.o. female with a hx of HTN, obesity, asthma  here to establish care in the [facility]. 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   Carla [name] had elevated blood pressure with her second child but not with her first. Does note she was heavier with her second child. Cutting out salt did help and did not develop preeclampsia. After that blood pressure would be high in the office but improve after rest. Initially felt lightheaded and near syncopal after initially starting medications. Notes it has been more consistently elevated over the last 6 months to a year.   Family history of hypertension with her mother also following in [facility].   Blood pressure checked with Omron arm cuff at home. Readings have been on average 164/104. She checks in the evening after work. Takes her Losartan in the evening. Just picked up Chlrothalidone but has not started yet.  she reports tobacco use never. Alcohol use 1-2x per week with a bottle of two of wine per month. For exercise she walks around campus. she eats at home and outside of the home a few times per month and does follow low sodium diet. She is seeing a nutritionist every week.   She is on Tirzepatide 15mg  through compounding pharmacy as not covered by insurance. Weight was 377 now down to 323.1 lbs.   Her husband notes he does not snore unless she is very tired. Has not notices her stop breathing. She does not wake feeling well rested but does not go to bed til late 11pm then wakes at 7:30 am. No significant caffeine intake.    Previous  antihypertensives: Amlodipine - cough, swelling Lisinopril - swelling Hydrochlorothiazide - dizzy, lightheaded, nausea  Secondary Causes of Hypertension  Medications/Herbal: multivitamin Sleep Apnea Renal artery stenosis Hyperaldosteronism Hyper/hypothyroidism: 11/2022 TSH 2.46 Pheochromocytoma: 2018 CT normal adrenals Cushing's syndrome: Cushingoid facies, central obesity, proximal muscle weakness, and ecchymoses, adrenal incidentaloma (cortisol) Coarctation of the aorta: BP symmetrical  Past Medical History:  Diagnosis Date   Allergy    Asthma    Fibroids    Obesity    Pregnancy induced hypertension     Past Surgical History:  Procedure Laterality Date   CERVICAL CERCLAGE  2003, 2008   CERVICAL CERCLAGE N/A 04/12/2016   Procedure: CERCLAGE CERVICAL;  Surgeon: Carrington Clamp, MD;  Location: [facility];  Service: Gynecology;  Laterality: N/A;   CERVICAL CERCLAGE N/A 09/23/2016   Procedure: removal CERCLAGE CERVICAL;  Surgeon: Essie Hart, MD;  Location: [facility] BIRTHING SUITES;  Service: Obstetrics;  Laterality: N/A;   CESAREAN SECTION N/A 09/23/2016   Procedure: CESAREAN SECTION;  Surgeon: Essie Hart, MD;  Location: [facility] BIRTHING SUITES;  Service: Obstetrics;  Laterality: N/A;  RNFA  Tracey T   DILATION AND CURETTAGE OF UTERUS     ectopic     MYOMECTOMY  2016   fibroid removal    Current Medications: Current Meds  Medication Sig   albuterol (VENTOLIN HFA) 108 (90 Base) MCG/ACT inhaler Inhale 2 puffs into the lungs every 4 (four) hours as needed for wheezing or shortness of breath (  before exercise).   cetirizine (ZYRTEC) 10 MG tablet Take 1 tablet (10 mg total) by mouth daily as needed for allergies.   chlorthalidone (HYGROTON) 25 MG tablet Take 0.5 tablets (12.5 mg total) by mouth daily.   cholecalciferol (VITAMIN D3) 25 MCG (1000 UNIT) tablet Take 1 tablet (1,000 Units total) by mouth daily.   cyclobenzaprine (FLEXERIL) 10 MG tablet Take 1 tablet (10 mg total) by mouth 3 (three)  times daily as needed for muscle spasms.   fluticasone (FLONASE) 50 MCG/ACT nasal spray Place 1 spray into both nostrils daily.   Multiple Vitamin (MULTIVITAMIN WITH MINERALS) TABS tablet Take 1 tablet by mouth daily.   olmesartan (BENICAR) 40 MG tablet Take 1 tablet (40 mg total) by mouth daily.   tirzepatide (MOUNJARO) 10 MG/0.5ML Pen Inject 10 mg into the skin once a week.   tiZANidine (ZANAFLEX) 4 MG tablet tizanidine 4 mg tablet  TAKE 1 TABLET BY MOUTH EVERY 6 HOURS AS NEEDED FOR MUSCLE SPASM   [DISCONTINUED] losartan (COZAAR) 50 MG tablet Take 2 tablets (100 mg total) by mouth daily.     Allergies:   Penicillins and Latex   Social History   Socioeconomic History   Marital status: Married    Spouse name: Not on file   Number of children: Not on file   Years of education: Not on file   Highest education level: Doctorate  Occupational History   Not on file  Tobacco Use   Smoking status: Never   Smokeless tobacco: Never   Tobacco comments:    Never smoker  Substance and Sexual Activity   Alcohol use: Yes    Alcohol/week: 1.0 - 2.0 standard drink of alcohol    Types: 1 - 2 Glasses of wine per week   Drug use: No   Sexual activity: Yes    Birth control/protection: Condom, None  Other Topics Concern   Not on file  Social History Narrative   Not on file   Social Drivers of Health   Financial Resource Strain: Low Risk  (04/10/2023)   Overall Financial Resource Strain (CARDIA)    Difficulty of Paying Living Expenses: Not hard at all  Food Insecurity: No Food Insecurity (04/10/2023)   Hunger Vital Sign    Worried About Running Out of Food in the Last Year: Never true    Ran Out of Food in the Last Year: Never true  Transportation Needs: No Transportation Needs (04/10/2023)   PRAPARE - Administrator, Civil Service (Medical): No    Lack of Transportation (Non-Medical): No  Physical Activity: Insufficiently Active (04/10/2023)   Exercise Vital Sign    Days of  Exercise per Week: 3 days    Minutes of Exercise per Session: 20 min  Stress: No Stress Concern Present (04/10/2023)   [employer] of Occupational Health - Occupational Stress Questionnaire    Feeling of Stress : Not at all  Social Connections: Socially Integrated (04/10/2023)   Social Connection and Isolation Panel [NHANES]    Frequency of Communication with Friends and Family: More than three times a week    Frequency of Social Gatherings with Friends and Family: More than three times a week    Attends Religious Services: More than 4 times per year    Active Member of [employer] or Organizations: Yes    Attends Engineer, structural: More than 4 times per year    Marital Status: Married     Family History: The patient's family history includes Alzheimer's  disease in her maternal grandmother; Asthma in her brother and maternal aunt; Cancer in her maternal grandfather, maternal uncle, paternal grandfather, and paternal uncle; Diabetes in her maternal uncle, paternal aunt, paternal grandmother, and paternal uncle; Heart disease in her maternal aunt and paternal uncle; Hypertension in her mother; Kidney disease in her paternal grandmother; Learning disabilities in her paternal uncle.  ROS:   Please see the history of present illness.     All other systems reviewed and are negative.  EKGs/Labs/Other Studies Reviewed:    EKG Interpretation Date/Time:  Thursday May 18 2023 16:28:13 EST Ventricular Rate:  68 PR Interval:  194 QRS Duration:  80 QT Interval:  414 QTC Calculation: 440 R Axis:   19  Text Interpretation: Normal sinus rhythm  No acute ST/T wave changes. Confirmed by Gillian Shields ([id number]) on 05/18/2023 4:29:24 PM    Recent Labs: 12/01/2022: TSH 2.46 04/06/2023: ALT 17; BUN 18; Creat 0.93; Hemoglobin 13.2; Platelets 359; Potassium 4.8; Sodium 138   Recent Lipid Panel    Component Value Date/Time   CHOL 223 (H) 04/06/2023 1128   TRIG 102 04/06/2023 1128   HDL 85  04/06/2023 1128   CHOLHDL 2.6 04/06/2023 1128   LDLCALC 118 (H) 04/06/2023 1128    Physical Exam:   VS:  BP (!) 173/110 (BP Location: Right Arm, Patient Position: Sitting)   Pulse 80   Ht 5\' 7"  (1.702 m)   Wt (!) 323 lb 1.6 oz (146.6 kg)   LMP 05/09/2023 (Approximate)   SpO2 100%   BMI 50.60 kg/m  , BMI Body mass index is 50.6 kg/m. GENERAL:  Well appearing HEENT: Pupils equal round and reactive, fundi not visualized, oral mucosa unremarkable NECK:  No jugular venous distention, waveform within normal limits, carotid upstroke brisk and symmetric, no bruits, no thyromegaly LYMPHATICS:  No cervical adenopathy LUNGS:  Clear to auscultation bilaterally HEART:  RRR.  PMI not displaced or sustained,S1 and S2 within normal limits, no S3, no S4, no clicks, no rubs, no murmurs ABD:  Flat, positive bowel sounds normal in frequency in pitch, no bruits, no rebound, no guarding, no midline pulsatile mass, no hepatomegaly, no splenomegaly EXT:  2 plus pulses throughout, no edema, no cyanosis no clubbing SKIN:  No rashes no nodules NEURO:  Cranial nerves II through XII grossly intact, motor grossly intact throughout PSYCH:  Cognitively intact, oriented to person place and time   ASSESSMENT/PLAN:    HTN - BP not at goal <130/80. Stop losartan 100mg  daily ? start Olmesartan 40mg  daily. Encouraged  to also start Chlorthalidone 12.5mg  daily as Rx'd by PCP.  Renal artery duplex to rule out stenosis.  Itamar sleep study, as below.  Labs in one week BMP, renin-aldosterone, morning cortisol.  Discussed to monitor BP at home at least 2 hours after medications and sitting for 5-10 minutes.   Daytime somnolence - STOPBang 4 with intermittent snoring, daytime somnolence. Itamar sleep study provided in clinic.  Obesity / HLD - Weight loss via diet and exercise encouraged. Discussed the impact being overweight would have on cardiovascular risk. The 10-year ASCVD risk score (Arnett DK, et al., 2019) is: 3.3%  Recommend continued lifestyle changes. Recommend aiming for 150 minutes of moderate intensity activity per week and following a heart healthy diet.    Screening for Secondary Hypertension:     05/18/2023    5:32 PM  Causes  Drugs/Herbals Screened     - Comments no excessive caffeine  Renovascular HTN Screened     -  Comments 04/2023 renal duplex ordered  Sleep Apnea Screened     - Comments 04/2023 itamar ordered  Thyroid Disease Screened     - Comments normal TSH 11/2022  Hyperaldosteronism Screened     - Comments 04/2023 renin-aldo ordered  Pheochromocytoma N/A     - Comments normal adrenal by prior CT  Cushing's Syndrome Screened     - Comments 04/2023 cortisol ordered  Coarctation of the Aorta N/A     - Comments bp symmetrical    Relevant Labs/Studies:    Latest Ref Rng & Units 04/06/2023   11:28 AM 12/01/2022   10:05 AM 07/30/2019    4:06 PM  Basic Labs  Sodium 135 - 146 mmol/L 138  139  138   Potassium 3.5 - 5.3 mmol/L 4.8  4.8  4.4   Creatinine 0.50 - 0.99 mg/dL 1.61  0.96  0.45        Latest Ref Rng & Units 12/01/2022   10:05 AM 06/02/2015    3:09 PM  Thyroid   TSH mIU/L 2.46  2.34                 05/18/2023    4:20 PM  Renovascular   Renal Artery [place] Completed Yes      Disposition:    FU with MD/APP/PharmD in 6 weeks    Medication Adjustments/Labs and Tests Ordered: Current medicines are reviewed at length with the patient today.  Concerns regarding medicines are outlined above.  Orders Placed This Encounter  Procedures   Aldosterone + renin activity w/ ratio   Basic metabolic panel   Cortisol   EKG 12-Lead   Itamar Sleep Study   VAS US RENAL ARTERY DUPLEX   Meds ordered this encounter  Medications   olmesartan (BENICAR) 40 MG tablet    Sig: Take 1 tablet (40 mg total) by mouth daily.    Dispense:  30 tablet    Refill:  2    Supervising Provider:   Jodelle Red [[id number]]     Signed, Alver Sorrow, NP  05/18/2023 5:33 PM    [facility]

## 2023-05-24 ENCOUNTER — Encounter: Payer: Self-pay | Admitting: Family Medicine

## 2023-05-29 ENCOUNTER — Other Ambulatory Visit (HOSPITAL_BASED_OUTPATIENT_CLINIC_OR_DEPARTMENT_OTHER): Payer: Self-pay

## 2023-05-29 ENCOUNTER — Telehealth: Payer: Self-pay | Admitting: Family

## 2023-05-29 ENCOUNTER — Ambulatory Visit (INDEPENDENT_AMBULATORY_CARE_PROVIDER_SITE_OTHER): Payer: BC Managed Care – PPO | Admitting: Family Medicine

## 2023-05-29 ENCOUNTER — Encounter: Payer: Self-pay | Admitting: Family Medicine

## 2023-05-29 ENCOUNTER — Ambulatory Visit (INDEPENDENT_AMBULATORY_CARE_PROVIDER_SITE_OTHER): Payer: 59 | Admitting: Podiatrist

## 2023-05-29 VITALS — BP 138/88 | HR 71 | Temp 98.3°F | Ht 67.0 in | Wt 316.0 lb

## 2023-05-29 DIAGNOSIS — B351 Tinea unguium: Secondary | ICD-10-CM

## 2023-05-29 DIAGNOSIS — Z6841 Body Mass Index (BMI) 40.0 and over, adult: Secondary | ICD-10-CM

## 2023-05-29 DIAGNOSIS — I10 Essential (primary) hypertension: Secondary | ICD-10-CM

## 2023-05-29 MED ORDER — TERBINAFINE HCL 250 MG PO TABS: 250.0000 mg | ORAL_TABLET | Freq: Every day | ORAL | 0 refills | Status: DC

## 2023-05-29 NOTE — Assessment & Plan Note (Signed)
 Continue Zepbound 12.5mg  weekly. Tolerating well. Will switch to [employer]. Follow up in 3 months or sooner if needed.

## 2023-05-29 NOTE — Telephone Encounter (Signed)
 Patient returned call. Pt changed insurance and would like an update on sleep study. Advised patient that a message will be sent to sleep study pool. Pt to be get lab drawn as well. Lab locations sent to patient.

## 2023-05-29 NOTE — Telephone Encounter (Signed)
 Pt returning call/requesting c/b

## 2023-05-29 NOTE — Assessment & Plan Note (Signed)
 Chronic well controlled. Continue Olmesartan and Chlorthalidone. Pending renal US and sleep study. Recommend heart healthy diet such as Mediterranean diet with whole grains, fruits, vegetable, fish, lean meats, nuts, and olive oil. Limit salt. Encouraged moderate walking, 3-5 times/week for 30-50 minutes each session. Aim for at least 150 minutes.week. Goal should be pace of 3 miles/hours, or walking 1.5 miles in 30 minutes. Avoid tobacco products. Avoid excess alcohol. Take medications as prescribed and bring medications and blood pressure log with cuff to each office visit. Seek medical care for chest pain, palpitations, shortness of breath with exertion, dizziness/lightheadedness, vision changes, recurrent headaches, or swelling of extremities. Follow up in 3 months or sooner if needed.

## 2023-05-29 NOTE — Progress Notes (Signed)
 Patient presents today for a second round of  laser treatment (2nd treatment of 4) -  on bilateral hallux nails-  she states she had clearing of the nails after her first round of 6 treatments and has recently noticed an increase in discoloration of the nails.  Bilateral hallux nails and left fifth nail are affected.     Diagnosed with mycotic nail infection by Dr. Allena Katz.   Toenail most affected bilateral halluxl left fifth . All other systems are negative.  Nails x 3  were filed thin. Laser therapy was administered to bilateral hallux and left fifth toenails-  single pass on non affected nails.   and patient tolerated the treatment well. All safety precautions were in place.   In the past she took 6 weeks of lamisil and stopped due to upset stomach.-  she would like to re start lamisil to try and clear the nails-  I checked recent labs and medications and sent in 30 days of lamisil.  We will discuss how she did with this round at her next visit.   Dr. Allena Katz recommended a total of 4 laser treatments for round 2-  will see her every 4-6 weeks x 2 more treatments.  Her next laser will be 3rd of 4.

## 2023-05-29 NOTE — Telephone Encounter (Signed)
 Left message for patient to call back

## 2023-05-29 NOTE — Progress Notes (Signed)
 Subjective:  HPI: Carla [name] is a 43 y.o. female presenting on 05/29/2023 for Follow-up (2 weeks (around 05/29/2023) for hypertension - JBG\\\/)   HPI Patient is in today for BP follow-up. She has recently established care with Cardiology. They are planning renal US and sleep studies. She was switched from Losartan to Olmesartan and started Chlorthalidone 12.5mg  daily. Her BP is well controlled on this regimen.  Ms Cozby also requests change in her Zepbound from compounding pharmacy to [place] direct. She is taking 12.5mg  weekly currently, she has been on this dose for 1.5 months without side effects.    HYPERTENSION without Chronic Kidney Disease Hypertension status: controlled  Satisfied with current treatment? yes Duration of hypertension: chronic BP monitoring frequency:  daily BP range: average 121/80 last week BP medication side effects:  no Medication compliance: good compliance Previous BP meds:amlodipine, chlorthalidone, HCTZ, lisinopril, losartan (cozaar), and olmesartan (benicar) Aspirin: no Recurrent headaches: no Visual changes: no Palpitations: no Dyspnea: no Chest pain: no Lower extremity edema: no Dizzy/lightheaded: yes    Review of Systems  All other systems reviewed and are negative.   Relevant past medical history reviewed and updated as indicated.   Past Medical History:  Diagnosis Date   Allergy    Asthma    Fibroids    Obesity    Pregnancy induced hypertension      Past Surgical History:  Procedure Laterality Date   CERVICAL CERCLAGE  2003, 2008   CERVICAL CERCLAGE N/A 04/12/2016   Procedure: CERCLAGE CERVICAL;  Surgeon: Carrington Clamp, MD;  Location: [facility];  Service: Gynecology;  Laterality: N/A;   CERVICAL CERCLAGE N/A 09/23/2016   Procedure: removal CERCLAGE CERVICAL;  Surgeon: Essie Hart, MD;  Location: [facility] BIRTHING SUITES;  Service: Obstetrics;  Laterality: N/A;   CESAREAN SECTION N/A 09/23/2016   Procedure: CESAREAN SECTION;  Surgeon:  Essie Hart, MD;  Location: [facility] BIRTHING SUITES;  Service: Obstetrics;  Laterality: N/A;  RNFA  Tracey T   DILATION AND CURETTAGE OF UTERUS     ectopic     MYOMECTOMY  2016   fibroid removal    Allergies and medications reviewed and updated.   Current Outpatient Medications:    albuterol (VENTOLIN HFA) 108 (90 Base) MCG/ACT inhaler, Inhale 2 puffs into the lungs every 4 (four) hours as needed for wheezing or shortness of breath (before exercise)., Disp: 18 g, Rfl: 2   cetirizine (ZYRTEC) 10 MG tablet, Take 1 tablet (10 mg total) by mouth daily as needed for allergies., Disp: 30 tablet, Rfl: 11   chlorthalidone (HYGROTON) 25 MG tablet, Take 0.5 tablets (12.5 mg total) by mouth daily., Disp: 30 each, Rfl: 0   cholecalciferol (VITAMIN D3) 25 MCG (1000 UNIT) tablet, Take 1 tablet (1,000 Units total) by mouth daily., Disp: 90 tablet, Rfl: 0   cyclobenzaprine (FLEXERIL) 10 MG tablet, Take 1 tablet (10 mg total) by mouth 3 (three) times daily as needed for muscle spasms., Disp: 30 tablet, Rfl: 0   fluticasone (FLONASE) 50 MCG/ACT nasal spray, Place 1 spray into both nostrils daily., Disp: 16 g, Rfl: 6   Multiple Vitamin (MULTIVITAMIN WITH MINERALS) TABS tablet, Take 1 tablet by mouth daily., Disp: , Rfl:    olmesartan (BENICAR) 40 MG tablet, Take 1 tablet (40 mg total) by mouth daily., Disp: 30 tablet, Rfl: 2   terbinafine (LAMISIL) 250 MG tablet, Take 1 tablet (250 mg total) by mouth daily., Disp: 30 tablet, Rfl: 0   tirzepatide (MOUNJARO) 10 MG/0.5ML Pen, Inject 10 mg into  the skin once a week., Disp: 2 mL, Rfl: 1   tiZANidine (ZANAFLEX) 4 MG tablet, tizanidine 4 mg tablet  TAKE 1 TABLET BY MOUTH EVERY 6 HOURS AS NEEDED FOR MUSCLE SPASM, Disp: , Rfl:   Allergies  Allergen Reactions   Penicillins Hives and Nausea And Vomiting    Has patient had a PCN reaction causing immediate rash, facial/tongue/throat swelling, SOB or lightheadedness with hypotension: No Has patient had a PCN reaction causing  severe rash involving mucus membranes or skin necrosis: Yes Has patient had a PCN reaction that required hospitalization No Has patient had a PCN reaction occurring within the last 10 years: No If all of the above answers are "NO", then may proceed with Cephalosporin use.    Latex Rash    Objective:   BP 138/88   Pulse 71   Temp 98.3 F (36.8 C) (Oral)   Ht 5\' 7"  (1.702 m)   Wt (!) 316 lb (143.3 kg)   LMP 05/09/2023 (Approximate)   SpO2 98%   BMI 49.49 kg/m      05/29/2023    9:15 AM 05/29/2023    9:11 AM 05/18/2023    3:36 PM  Vitals with BMI  Height  5\' 7"    Weight  316 lbs   BMI  49.48   Systolic 138 138 086  Diastolic 88 92 110  Pulse  71 80     Physical Exam Vitals and nursing note reviewed.  Constitutional:      Appearance: Normal appearance. She is obese.  HENT:     Head: Normocephalic and atraumatic.  Cardiovascular:     Rate and Rhythm: Normal rate and regular rhythm.     Pulses: Normal pulses.     Heart sounds: Normal heart sounds.  Pulmonary:     Effort: Pulmonary effort is normal.     Breath sounds: Normal breath sounds.  Skin:    General: Skin is warm and dry.  Neurological:     General: No focal deficit present.     Mental Status: She is alert and oriented to person, place, and time. Mental status is at baseline.  Psychiatric:        Mood and Affect: Mood normal.        Behavior: Behavior normal.        Thought Content: Thought content normal.        Judgment: Judgment normal.     Assessment & Plan:  Essential hypertension Assessment & Plan: Chronic well controlled. Continue Olmesartan and Chlorthalidone. Pending renal US and sleep study. Recommend heart healthy diet such as Mediterranean diet with whole grains, fruits, vegetable, fish, lean meats, nuts, and olive oil. Limit salt. Encouraged moderate walking, 3-5 times/week for 30-50 minutes each session. Aim for at least 150 minutes.week. Goal should be pace of 3 miles/hours, or walking 1.5  miles in 30 minutes. Avoid tobacco products. Avoid excess alcohol. Take medications as prescribed and bring medications and blood pressure log with cuff to each office visit. Seek medical care for chest pain, palpitations, shortness of breath with exertion, dizziness/lightheadedness, vision changes, recurrent headaches, or swelling of extremities. Follow up in 3 months or sooner if needed.   Morbid obesity (HCC) Assessment & Plan: Continue Zepbound 12.5mg  weekly. Tolerating well. Will switch to [employer]. Follow up in 3 months or sooner if needed.       Follow up plan: Return in about 3 months (around 08/29/2023).  Park Meo, FNP

## 2023-05-29 NOTE — Telephone Encounter (Signed)
 Pt is requesting cb to get update on sleep study and has some other questions regarding labs

## 2023-05-30 ENCOUNTER — Encounter: Payer: Self-pay | Admitting: Family Medicine

## 2023-05-30 ENCOUNTER — Encounter (HOSPITAL_BASED_OUTPATIENT_CLINIC_OR_DEPARTMENT_OTHER): Payer: Self-pay

## 2023-05-30 NOTE — Telephone Encounter (Signed)
 erroneous

## 2023-05-31 ENCOUNTER — Other Ambulatory Visit: Payer: Self-pay | Admitting: Family Medicine

## 2023-05-31 ENCOUNTER — Telehealth: Payer: Self-pay | Admitting: Family Medicine

## 2023-05-31 MED ORDER — ZEPBOUND 12.5 MG/0.5ML ~~LOC~~ SOAJ: 12.5000 mg | SUBCUTANEOUS | 0 refills | Status: DC

## 2023-05-31 NOTE — Telephone Encounter (Signed)
 Copied from CRM [id number]. Topic: Medical Record Request - Provider/Facility Request >> May 31, 2023  3:46 PM Eula Fried wrote: Reason for CRM: Provider requesting Medical records be faxed over to [phone] patient tried to do it on mychart but the doctors office cannot download - Att Loleta Books - at [employer]

## 2023-06-01 MED ORDER — ZEPBOUND 10 MG/0.5ML ~~LOC~~ SOAJ: 10.0000 mg | SUBCUTANEOUS | 0 refills | Status: DC

## 2023-06-02 ENCOUNTER — Ambulatory Visit: Payer: Self-pay | Admitting: Family Medicine

## 2023-06-02 NOTE — Telephone Encounter (Signed)
 Ordering provider: C. Walker Associated diagnoses: R40.0 WatchPAT PA obtained on 06/02/2023 by Brunetta Genera, LPN. Authorization: Yes; tracking ID [id number] Patient notified of PIN ([id number]) on 06/02/2023 via Notification Method: phone.

## 2023-06-02 NOTE — Telephone Encounter (Signed)
  Chief Complaint: lightheaded , new medication change Symptoms: BP was 106/67 prior to call . BP 124/71 HR 95 beginning of call rechecked for BP 118/74 HR 87. Lightheaded with standing or change in head position. Started new med for BP management .  Reports feeling "tipsy" with standing. Reports heart beats faster at times.  Frequency: today  Pertinent Negatives: Patient denies chest pain no difficulty breathing no feeling of passing out.  Disposition: [] ED /[] Urgent Care (no appt availability in office) / [] Appointment(In office/virtual)/ []  [facility]/ [] Home Care/ [] Refused Recommended Disposition /[] [facility]/ [x]  Follow-up with PCP Additional Notes:   PCP out of office today . Recommended appt . Recommended to drink water / fruit juice and eat regular meals. Allow extra time before standing of change position quickly. Patient requesting to allow care advise and if sx continue she will call back for appt. Recommended if sx worsen go to UC/ED over weekend.  Please advise and can send recommendations via My chart please.     Copied from CRM [id number]. Topic: Clinical - Red Word Triage >> Jun 02, 2023  9:40 AM Marlow Baars wrote: Red Word that prompted transfer to Nurse Triage: The patient called in stating she has been extremely lightheaded and her bp has been really low for her. She states her average although high runs around 160-101 and as of this morning it is 106/67. With this I will transfer her to E2C2 NT to be traiged Reason for Disposition  Taking a medicine that could cause dizziness (e.g., blood pressure medications, diuretics)  Answer Assessment - Initial Assessment Questions 1. DESCRIPTION: "Describe your dizziness."     lightheaded 2. LIGHTHEADED: "Do you feel lightheaded?" (e.g., somewhat faint, woozy, weak upon standing)     Sore all over, 'tipsy" feeling with standing 3. VERTIGO: "Do you feel like either you or the room is spinning or tilting?" (i.e.  vertigo)     na 4. SEVERITY: "How bad is it?"  "Do you feel like you are going to faint?" "Can you stand and walk?"   - MILD: Feels slightly dizzy, but walking normally.   - MODERATE: Feels unsteady when walking, but not falling; interferes with normal activities (e.g., school, work).   - SEVERE: Unable to walk without falling, or requires assistance to walk without falling; feels like passing out now.      Mild  5. ONSET:  "When did the dizziness begin?"     This am  6. AGGRAVATING FACTORS: "Does anything make it worse?" (e.g., standing, change in head position)     Standing  7. HEART RATE: "Can you tell me your heart rate?" "How many beats in 15 seconds?"  (Note: not all patients can do this)       95 8. CAUSE: "What do you think is causing the dizziness?"     BP medication change  9. RECURRENT SYMPTOM: "Have you had dizziness before?" If Yes, ask: "When was the last time?" "What happened that time?"     Yes 10. OTHER SYMPTOMS: "Do you have any other symptoms?" (e.g., fever, chest pain, vomiting, diarrhea, bleeding)       Lightheaded change in head position causes sx 11. PREGNANCY: "Is there any chance you are pregnant?" "When was your last menstrual period?"       na  Protocols used: Dizziness - Lightheadedness-A-AH

## 2023-06-05 ENCOUNTER — Other Ambulatory Visit: Payer: Self-pay | Admitting: Family Medicine

## 2023-06-05 ENCOUNTER — Telehealth: Payer: Self-pay

## 2023-06-05 ENCOUNTER — Encounter (HOSPITAL_BASED_OUTPATIENT_CLINIC_OR_DEPARTMENT_OTHER): Payer: Self-pay

## 2023-06-05 LAB — CORTISOL: Cortisol: 8.2 ug/dL (ref 6.2–19.4)

## 2023-06-05 LAB — BASIC METABOLIC PANEL
BUN/Creatinine Ratio: 18 (ref 9–23)
BUN: 18 mg/dL (ref 6–24)
CO2: 21 mmol/L (ref 20–29)
Calcium: 9.8 mg/dL (ref 8.7–10.2)
Chloride: 102 mmol/L (ref 96–106)
Creatinine, Ser: 1.02 mg/dL — ABNORMAL HIGH (ref 0.57–1.00)
Glucose: 84 mg/dL (ref 70–99)
Potassium: 4.4 mmol/L (ref 3.5–5.2)
Sodium: 142 mmol/L (ref 134–144)
eGFR: 71 mL/min/{1.73_m2} (ref >59)

## 2023-06-05 LAB — ALDOSTERONE + RENIN ACTIVITY W/ RATIO
Aldos/Renin Ratio: 2.6 (ref 0.0–30.0)
Aldosterone: 13.2 ng/dL (ref 0.0–30.0)
Renin Activity, Plasma: 5.023 ng/mL/h (ref 0.167–5.380)

## 2023-06-05 NOTE — Telephone Encounter (Addendum)
**Note De-Identified Carla [name] Obfuscation** Ordering provider: Gillian Shields, NP Associated diagnoses: Snoring-R06.83 and Somnolence-R40.0  WatchPAT PA obtained on 06/05/2023 by Carla [name], Carla Formosa, LPN. Authorization: [employer] (Primary plan)-Per the [employer] Provider Portal: Order ID: [id number] Authorized Approval Valid Through:06/02/2023 - 07/31/2023  [employer] insurance is secondary-Per [employer]: Order ID: [id number] Authorized Approval Valid Through:06/05/2023 - 08/03/2023  Patient notified of PIN (1234) on 06/05/2023 Carla [name] Notification Method: phone.  Phone note routed to covering staff for follow-up.

## 2023-06-07 ENCOUNTER — Other Ambulatory Visit: Payer: Self-pay | Admitting: Family Medicine

## 2023-06-10 ENCOUNTER — Encounter (INDEPENDENT_AMBULATORY_CARE_PROVIDER_SITE_OTHER): Payer: Self-pay | Admitting: Cardiology

## 2023-06-10 DIAGNOSIS — G4733 Obstructive sleep apnea (adult) (pediatric): Secondary | ICD-10-CM | POA: Diagnosis not present

## 2023-06-12 ENCOUNTER — Telehealth: Payer: Self-pay

## 2023-06-12 ENCOUNTER — Ambulatory Visit: Attending: Family

## 2023-06-12 ENCOUNTER — Ambulatory Visit (HOSPITAL_BASED_OUTPATIENT_CLINIC_OR_DEPARTMENT_OTHER): Payer: BC Managed Care – PPO

## 2023-06-12 DIAGNOSIS — R400 Somnolence: Secondary | ICD-10-CM

## 2023-06-12 DIAGNOSIS — E782 Mixed hyperlipidemia: Secondary | ICD-10-CM

## 2023-06-12 DIAGNOSIS — I10 Essential (primary) hypertension: Secondary | ICD-10-CM

## 2023-06-12 DIAGNOSIS — G4733 Obstructive sleep apnea (adult) (pediatric): Secondary | ICD-10-CM

## 2023-06-12 NOTE — Telephone Encounter (Signed)
 Notified patient of sleep study results and recommendations. Patient is interested in getting approved for Zepbound while on CPAP Therapy with OSA diagnosis. All questions were answered and patient verbalized understanding. CPAP Titration ordered today.

## 2023-06-12 NOTE — Telephone Encounter (Signed)
-----   Message from Armanda Magic sent at 06/12/2023  9:14 AM EDT ----- Please let patient know that they have sleep apnea.  Recommend therapeutic CPAP titration for treatment of patient's sleep disordered breathing.

## 2023-06-12 NOTE — Procedures (Signed)
   SLEEP STUDY REPORT Patient Information Study Date: 06/10/2023 Patient Name: Carla [name] Patient ID: [id number] Birth Date: 05-Apr-1980 Age: 43 Gender: Female BMI: 50.9 (W=324 lb, H=5' 7'') Referring Physician: Gillian Shields, NP  TEST DESCRIPTION: Home sleep apnea testing was completed using the WatchPat, a Type 1 device, utilizing  peripheral arterial tonometry (PAT), chest movement, actigraphy, pulse oximetry, pulse rate, body position and snore.  AHI was calculated with apnea and hypopnea using valid sleep time as the denominator. RDI includes apneas,  hypopneas, and RERAs. The data acquired and the scoring of sleep and all associated events were performed in  accordance with the recommended standards and specifications as outlined in the AASM Manual for the Scoring of  Sleep and Associated Events 2.2.0 (2015).  FINDINGS:  1. Moderate Obstructive Sleep Apnea with AHI 24.3/hr.   2. No Central Sleep Apnea with pAHIc 2.6/hr.  3. Oxygen desaturations as low as 81%.  4. Moderate snoring was present. O2 sats were < 88% for 5.1 min.  5. Total sleep time was 6 hrs and 32 min.  6. 29.5 % of total sleep time was spent in REM sleep.   7. Shortened sleep onset latency at 5 min  8. Prlonged REM sleep onset latency at 111 min.   9. Total awakenings were 4.  10. Arrhythmia detection: None  DIAGNOSIS:  Moderate Obstructive Sleep Apnea (G47.33) Nocturnal Hypoxemia  RECOMMENDATIONS: 1. Clinical correlation of these findings is necessary. The decision to treat obstructive sleep apnea (OSA) is usually  based on the presence of apnea symptoms or the presence of associated medical conditions such as Hypertension,  Congestive Heart Failure, Atrial Fibrillation or Obesity. The most common symptoms of OSA are snoring, gasping for  breath while sleeping, daytime sleepiness and fatigue.   2. Initiating apnea therapy is recommended given the presence of symptoms and/or associated conditions.   Recommend proceeding with one of the following:   a. Auto-CPAP therapy with a pressure range of 5-20cm H2O.   b. An oral appliance (OA) that can be obtained from certain dentists with expertise in sleep medicine. These are  primarily of use in non-obese patients with mild and moderate disease.   c. An ENT consultation which may be useful to look for specific causes of obstruction and possible treatment  options.   d. If patient is intolerant to PAP therapy, consider referral to ENT for evaluation for hypoglossal nerve stimulator.   3. Close follow-up is necessary to ensure success with CPAP or oral appliance therapy for maximum benefit .  4. A follow-up oximetry study on CPAP is recommended to assess the adequacy of therapy and determine the need  for supplemental oxygen or the potential need for Bi-level therapy. An arterial blood gas to determine the adequacy of  baseline ventilation and oxygenation should also be considered.  5. Healthy sleep recommendations include: adequate nightly sleep (normal 7-9 hrs/night), avoidance of caffeine after  noon and alcohol near bedtime, and maintaining a sleep environment that is cool, dark and quiet.  6. Weight loss for overweight patients is recommended. Even modest amounts of weight loss can significantly  improve the severity of sleep apnea.  7. Snoring recommendations include: weight loss where appropriate, side sleeping, and avoidance of alcohol before  bed.  8. Operation of motor vehicle should not be performed when sleepy.  Signature: Armanda Magic, MD; [facility]; Diplomat, American Board of Sleep  Medicine Electronically Signed: 06/12/2023 9:13:21 AM

## 2023-06-13 ENCOUNTER — Telehealth: Payer: Self-pay | Admitting: Pharmacy Technician

## 2023-06-13 ENCOUNTER — Other Ambulatory Visit (HOSPITAL_COMMUNITY): Payer: Self-pay

## 2023-06-13 NOTE — Telephone Encounter (Signed)
 Pharmacy Patient Advocate Encounter   Received notification from Pt Calls Messages that prior authorization for zepbound is required/requested.   Insurance verification completed.   The patient is insured through  [place]  .   Per test claim: PA required; PA submitted to above mentioned insurance via CoverMyMeds Key/confirmation #/EOC [facility] Status is pending

## 2023-06-14 ENCOUNTER — Other Ambulatory Visit (HOSPITAL_COMMUNITY): Payer: Self-pay

## 2023-06-14 NOTE — Telephone Encounter (Signed)
 Pharmacy Patient Advocate Encounter  Received notification from  [employer]  that Prior Authorization for zepbound has been DENIED.  Full denial letter will be uploaded to the media tab. See denial reason below.   PA #/Case ID/Reference #: [id number]

## 2023-06-15 ENCOUNTER — Telehealth: Payer: Self-pay | Admitting: Family Medicine

## 2023-06-15 NOTE — Telephone Encounter (Signed)
 Marland Kitchen

## 2023-06-20 ENCOUNTER — Encounter (HOSPITAL_BASED_OUTPATIENT_CLINIC_OR_DEPARTMENT_OTHER): Payer: Self-pay

## 2023-06-21 NOTE — Telephone Encounter (Signed)
 Patient has sent message to scheduling following up on her CPAP titration authorization and Zepbound authorization. Please advise.

## 2023-06-22 ENCOUNTER — Telehealth: Payer: Self-pay | Admitting: Pharmacy Technician

## 2023-06-22 ENCOUNTER — Other Ambulatory Visit (HOSPITAL_COMMUNITY): Payer: Self-pay

## 2023-06-22 NOTE — Telephone Encounter (Signed)
 I faxed appeal for zepbound to her primary insurance [employer]   Appeal fax [phone]  [phone] appeal ph number

## 2023-06-22 NOTE — Telephone Encounter (Signed)
 I tried to run ozempic to see if we could get that approved and it came back not covered on both her insurances

## 2023-06-22 NOTE — Telephone Encounter (Signed)
 I spoke to the patient and advised of the denial from primary and secondary insurance. After speaking I called [employer] to see if they can send me a form since the system automatically kicked it out.

## 2023-06-22 NOTE — Telephone Encounter (Signed)
    This is the denial from the primary plan

## 2023-06-22 NOTE — Telephone Encounter (Signed)
 Please review and advise.

## 2023-06-26 ENCOUNTER — Other Ambulatory Visit (HOSPITAL_COMMUNITY): Payer: Self-pay

## 2023-06-26 ENCOUNTER — Telehealth: Payer: Self-pay

## 2023-06-26 NOTE — Telephone Encounter (Signed)
**Note De-Identified [place] St[place] Obfuscation** Per the [employer] provider portal: CPT Code: 95811-CPAP Titration Approved. Valid dates: 06/26/2023-09/23/2023 Order ID #: [id number]  I have forwarded the CPAP Titration order to the sleep lab so they can contact the pt to schedule the test and I have made the pt aware of the approval as well.

## 2023-06-26 NOTE — Telephone Encounter (Signed)
I called and spoke to the patient and answered her questions.

## 2023-06-26 NOTE — Telephone Encounter (Signed)
**Note De-Identified Tinna Kolker Obfuscation** I called the pt and made her aware that [employer] has approved coverage of her CPAP Titration and she is aware that someone from the sleep lab will be contacting her to schedule the CPAP Titration.  While on the call she had a couple questions about Zepbound that I could not answer so per her request I am forwarding this message to the pharmacy team to contact the pt to discuss Zepbound.

## 2023-06-27 ENCOUNTER — Other Ambulatory Visit (HOSPITAL_COMMUNITY): Payer: Self-pay

## 2023-06-27 NOTE — Telephone Encounter (Signed)
 Me to [employer]      06/27/23  5:22 PM Hi,  Yes the denial's I sent you via email were from your secondary insurance, I'm sorry I thought I explained that on our phone calls. Also, I tried to explain that: "I spoke to your primary insurance and they never received the prior authorization we did due to the system automatically kicking it out stating drug not covered. [employer] confirmed the drug is not on your plan formulary and is excluded from your plan." So then I sent the appeal to them (your primary insurance [employer]) on 06/22/23. I also received confirmation that they received my appeal via fax on 06/22/23 at 2:50pm. I did try to submit the prior authorization via covermymeds on 06/22/23 with [employer] and it immediately kicked out which is why I called your primary insurance to see if I could do a prior authorization over the phone and they said I could not and had to do an appeal. Below is a screenshot of the covermymeds that I did on 06/22/23 that I spoke about. I called just now to verify and the appeal Case# [id number] for zepbound under your primary insurance [employer]. They said to allow 30 days for their decision. Your denials from your secondary insurance will not impart your approvals/denials from your primary insurance.    This MyChart message has not been read. Marlene Lard, RN to Me  KW   06/27/23  4:50 PM Note !     Torey Paulos to [employer] [facility] Triage (supporting Alver Sorrow, NP)      06/27/23  4:47 PM Hi Judeth Cornfield,   I called my insurance. They never received the appeal. I had them send over the prior authorization to [facility] for Gillian Shields, FNP for Zepbound 15mg .    Also, when I viewed the secure messages you sent me on the previous denials, they were both from my secondary insurance under my husband. So this is not the correct primary and would likely be denied again.    My primary insurance sent a prior authorization request today. It was sent to the [facility]  number, [phone]. They explained that the information should come from CoverMyMeds. That is the PA request site for my primary with [employer], through [employer]). My concern is that the PA has not been going to the appropriate insurance covered and may impact approvals.    Please let me know when you have received the PA. I called the office and let them know to expect.    Thanks again!   Ralynn Skalla

## 2023-06-27 NOTE — Telephone Encounter (Signed)
 She states [employer] faxed it over to [phone]

## 2023-06-27 NOTE — Telephone Encounter (Signed)
 Pt called in stating she spoke to her primary insurance and they didn't receive PA or appeal for Zepound. Please advise.

## 2023-06-27 NOTE — Telephone Encounter (Signed)
 Carla [name]

## 2023-07-03 ENCOUNTER — Telehealth: Payer: Self-pay | Admitting: Podiatrist

## 2023-07-03 ENCOUNTER — Ambulatory Visit (INDEPENDENT_AMBULATORY_CARE_PROVIDER_SITE_OTHER): Admitting: Podiatrist

## 2023-07-03 DIAGNOSIS — B351 Tinea unguium: Secondary | ICD-10-CM

## 2023-07-03 MED ORDER — TERBINAFINE HCL 250 MG PO TABS: 250.0000 mg | ORAL_TABLET | Freq: Every day | ORAL | 0 refills | Status: DC

## 2023-07-03 NOTE — Progress Notes (Signed)
 Patient presents today for a second round of  laser treatment (3rd treatment of 4) -  on bilateral hallux nails-  she states she had clearing of the nails after her first round of 6 treatments and has recently noticed an increase in discoloration of the nails.  Bilateral hallux nails and left fifth nail are affected.     Diagnosed with mycotic nail infection by Dr. Allena Katz.   Toenail most affected bilateral hallux and left fifth . All other systems are negative.  Nails x 3  were filed thin. Laser therapy was administered to bilateral hallux and left fifth toenails-  single pass on non affected nails.   and patient tolerated the treatment well. All safety precautions were in place.   She took the 30 days of lamisil as prescribed and stated if she takes it at night she has no stomach upset so she would like to take the full course of lamisil.  I rx'd 30 more days of lamisil for her to take in addition to lasering of nails.     Dr. Allena Katz recommended a total of 4 laser treatments for round 2-  will see her every 4-6 weeks x 1 more treatments.  Her next laser will be 3rd of 4-  will see how she is doing with lamisil and will rx her last 30 days at that visit as long as she is still tolerating the medication well.

## 2023-07-03 NOTE — Telephone Encounter (Signed)
 Would like to have a Refill on the medication terbinafine forgot to ask you during the visit.

## 2023-07-05 ENCOUNTER — Other Ambulatory Visit (HOSPITAL_COMMUNITY): Payer: Self-pay

## 2023-07-10 ENCOUNTER — Encounter: Payer: Self-pay | Admitting: Pharmacist Clinician (PhC)/ Clinical Pharmacy Specialist

## 2023-07-10 ENCOUNTER — Ambulatory Visit (HOSPITAL_BASED_OUTPATIENT_CLINIC_OR_DEPARTMENT_OTHER): Payer: BC Managed Care – PPO | Admitting: Pharmacist Clinician (PhC)/ Clinical Pharmacy Specialist

## 2023-07-10 ENCOUNTER — Encounter (HOSPITAL_BASED_OUTPATIENT_CLINIC_OR_DEPARTMENT_OTHER): Payer: Self-pay | Admitting: Pharmacist Clinician (PhC)/ Clinical Pharmacy Specialist

## 2023-07-10 VITALS — BP 129/84 | HR 78 | Ht 67.5 in | Wt 320.2 lb

## 2023-07-10 DIAGNOSIS — I10 Essential (primary) hypertension: Secondary | ICD-10-CM

## 2023-07-10 NOTE — Assessment & Plan Note (Addendum)
 Assessment: BP is controlled in office BP 129/84 mmHg; slight diastolic elevation Home average in past 4 weeks 131/84 Tolerates chlorthalidone and olmesartan well without any side effects Currently taking electrolyte packet from nutritionist.  There is 210 mg sodium in each of these and < 2 mEq of potassium.  Recommend to hold for 3 week, as her current electrolyte levels are well WNL.  Sodium may be adding to fluid retention and/or BP.   Denies SOB, palpitation, chest pain, headaches,or swelling Reiterated the importance of regular exercise and low salt diet   Plan:  Stop taking electrolyte packets for now.  After 3 weeks off this, please go to the lab and get BMET.  Will determine if this is needed.  (If she develops cramping, she is welcome to add in calcium/magnesium/zinc tablets Continue taking olmesartan 40 mg every day, chlorthalidone 12.5 mg every day  Patient to keep record of BP readings with heart rate and report to us  at the next visit Patient to follow up with PharmD in 2 months  Labs ordered today: BMET ~ 2 weeks (after stopping electrolyte powder)

## 2023-07-10 NOTE — Patient Instructions (Addendum)
 Follow up appointment: 2 months - Wednesday June 18 at 8 am  Go to the lab in 2 weeks,   Take your BP meds as follows: Continue with the olmesartan and chlorthalidone  Stop using the electrolyte supplements for the next 2 weeks.  At the end of that time, come into the lab so we can see how they are doing.    Check your blood pressure at home daily (if able) and keep record of the readings.  Your blood pressure goal is < 130/80  To check your pressure at home you will need to:  1. Sit up in a chair, with feet flat on the floor and back supported. Do not cross your ankles or legs. 2. Rest your left arm so that the cuff is about heart level. If the cuff goes on your upper arm,  then just relax the arm on the table, arm of the chair or your lap. If you have a wrist cuff, we  suggest relaxing your wrist against your chest (think of it as Pledging the Flag with the  wrong arm).  3. Place the cuff snugly around your arm, about 1 inch above the crook of your elbow. The  cords should be inside the groove of your elbow.  4. Sit quietly, with the cuff in place, for about 5 minutes. After that 5 minutes press the power  button to start a reading. 5. Do not talk or move while the reading is taking place.  6. Record your readings on a sheet of paper. Although most cuffs have a memory, it is often  easier to see a pattern developing when the numbers are all in front of you.  7. You can repeat the reading after 1-3 minutes if it is recommended  Make sure your bladder is empty and you have not had caffeine or tobacco within the last 30 min  Always bring your blood pressure log with you to your appointments. If you have not brought your monitor in to be double checked for accuracy, please bring it to your next appointment.  You can find a list of quality blood pressure cuffs at [email or web address]  Important lifestyle changes to control high blood pressure  Intervention  Effect on the BP  Lose extra  pounds and watch your waistline Weight loss is one of the most effective lifestyle changes for controlling blood pressure. If you're overweight or obese, losing even a small amount of weight can help reduce blood pressure. Blood pressure might go down by about 1 millimeter of mercury (mm Hg) with each kilogram (about 2.2 pounds) of weight lost.  Exercise regularly As a general goal, aim for at least 30 minutes of moderate physical activity every day. Regular physical activity can lower high blood pressure by about 5 to 8 mm Hg.  Eat a healthy diet Eating a diet rich in whole grains, fruits, vegetables, and low-fat dairy products and low in saturated fat and cholesterol. A healthy diet can lower high blood pressure by up to 11 mm Hg.  Reduce salt (sodium) in your diet Even a small reduction of sodium in the diet can improve heart health and reduce high blood pressure by about 5 to 6 mm Hg.  Limit alcohol One drink equals 12 ounces of beer, 5 ounces of wine, or 1.5 ounces of 80-proof liquor.  Limiting alcohol to less than one drink a day for women or two drinks a day for men can help lower blood pressure by about 4  mm Hg.   If you have any questions or concerns please use My Chart to send questions or call the office at [phone]

## 2023-07-10 NOTE — Progress Notes (Addendum)
 Office Visit    Patient Name: Carla [name] Date of Encounter: 07/10/2023  Primary Care Provider:  Park Meo, FNP Primary Cardiologist:  None  Chief Complaint    Hypertension - Advanced hypertension clinic  Past Medical History   obesity BMI 49.38; on tirzepatide, was getting compounded, now buying from [employer]  asthma Cetirizine, fluticasone, prn albuterol    Allergies  Allergen Reactions   Penicillins Hives and Nausea And Vomiting    Has patient had a PCN reaction causing immediate rash, facial/tongue/throat swelling, SOB or lightheadedness with hypotension: No Has patient had a PCN reaction causing severe rash involving mucus membranes or skin necrosis: Yes Has patient had a PCN reaction that required hospitalization No Has patient had a PCN reaction occurring within the last 10 years: No If all of the above answers are "NO", then may proceed with Cephalosporin use.    Latex Rash    History of Present Illness    Carla [name] is a 43 y.o. female patient who was referred to the [facility] by Kurtis Bushman FNP.  Patient noted that her blood pressure was elevated during her second pregnancy, but did not develop preeclampisa.  Readings fluctuated for several years, improving after rest, but have been more consistently elevated for the past 6-12 months.  Her BP at that visit with Gillian Shields was 173/110 on losartan 100 mg.  She had been prescribed chlorthalidone, but had not yet started.  Losartan was switched to olmesartan, and secondary causes were reviewed.  She had lost some weight with compounded tirzepetide, but is no longer able to get that.  Her insurance denied coverage for Zepbound, even with the OSA diagnosis.   Today she notes that she is doing well with the olmesartan and chlorthalidone and her home BP readings are averaging 127-134 (phone shows weekly averages)/80-88.  She does note that her nutritionist has given her an electrolyte  powder to take daily with the chlorthalidone, to avoid cramping.   Each packet contains 210 mg sodium and less than 2 mEq of potassium.  She also notes today that her weight does fluctuate, as she has episodes where she holds more water.    Blood Pressure Goal:  130/80  Current Medications: olmesartan 40 mg every day, chlorthalidone 12.5 mg every day  - both at hs  Adherence Assessment  Do you ever forget to take your medication? [x] Yes [] No  Do you ever skip doses due to side effects? [] Yes [x] No  Do you have trouble affording your medicines? [x] Yes - Zepbound - cash [] No  Are you ever unable to pick up your medication due to transportation difficulties? [] Yes [x] No   Adherence strategy: 7 day pill minder  Previously tried:   Amlodipine - cough, swelling Lisinopril - swelling Hydrochlorothiazide - dizzy, lightheaded, nausea  Family Hx:   mother with hypertension (also seen with our clinic) still struggling; father deceased at 35 - accidental; siblings healthy; 2 kids 46, 6, daughter (10) no BP issues  Social Hx:      Tobacco: no  Alcohol: 1-2 times per week  Caffeine: aobut once weekly - coffee   /Diet:  mostly home cooked meals, does eat out a few times per month; working with nutritionist    Exercise: walks on [place] ([employer])  Home BP readings:  newer [employer] cuff, has app on phone that shows weekly averages - last 4 are 127/83  134/88  134/80  130/84  Home cuff  Accessory Clinical Findings  Lab Results  Component Value Date   CREATININE 1.02 (H) 05/31/2023   BUN 18 05/31/2023   NA 142 05/31/2023   K 4.4 05/31/2023   CL 102 05/31/2023   CO2 21 05/31/2023   Lab Results  Component Value Date   ALT 17 04/06/2023   AST 18 04/06/2023   ALKPHOS 56 09/23/2016   BILITOT 0.3 04/06/2023   Lab Results  Component Value Date   HGBA1C 5.9 (H) 12/01/2022    Screening for Secondary Hypertension:      05/18/2023    5:32 PM  Causes  Drugs/Herbals Screened     -  Comments no excessive caffeine  Renovascular HTN Screened     - Comments 04/2023 renal duplex ordered  Sleep Apnea Screened     - Comments 04/2023 itamar ordered  Thyroid Disease Screened     - Comments normal TSH 11/2022  Hyperaldosteronism Screened     - Comments 04/2023 renin-aldo ordered  Pheochromocytoma N/A     - Comments normal adrenal by prior CT  Cushing's Syndrome Screened     - Comments 04/2023 cortisol ordered  Coarctation of the Aorta N/A     - Comments bp symmetrical    Relevant Labs/Studies:    Latest Ref Rng & Units 05/31/2023   10:51 AM 04/06/2023   11:28 AM 12/01/2022   10:05 AM  Basic Labs  Sodium 134 - 144 mmol/L 142  138  139   Potassium 3.5 - 5.2 mmol/L 4.4  4.8  4.8   Creatinine 0.57 - 1.00 mg/dL 1.61  0.96  0.45        Latest Ref Rng & Units 12/01/2022   10:05 AM 06/02/2015    3:09 PM  Thyroid   TSH mIU/L 2.46  2.34        Latest Ref Rng & Units 05/31/2023   10:51 AM  Renin/Aldosterone   Aldosterone 0.0 - 30.0 ng/dL 40.9   Aldos/Renin Ratio 0.0 - 30.0 2.6              06/19/2023   12:32 PM  Renovascular   Renal Artery [place] Completed Yes      Home Medications    Current Outpatient Medications  Medication Sig Dispense Refill   albuterol (VENTOLIN HFA) 108 (90 Base) MCG/ACT inhaler Inhale 2 puffs into the lungs every 4 (four) hours as needed for wheezing or shortness of breath (before exercise). 18 g 2   cetirizine (ZYRTEC) 10 MG tablet Take 1 tablet (10 mg total) by mouth daily as needed for allergies. 30 tablet 11   chlorthalidone (HYGROTON) 25 MG tablet TAKE 1/2 TABLET BY MOUTH EVERY DAY 45 tablet 1   cholecalciferol (VITAMIN D3) 25 MCG (1000 UNIT) tablet Take 1 tablet (1,000 Units total) by mouth daily. 90 tablet 0   cyclobenzaprine (FLEXERIL) 10 MG tablet Take 1 tablet (10 mg total) by mouth 3 (three) times daily as needed for muscle spasms. 30 tablet 0   fluticasone (FLONASE) 50 MCG/ACT nasal spray Place 1 spray into both nostrils daily. 16 g 6    Multiple Vitamin (MULTIVITAMIN WITH MINERALS) TABS tablet Take 1 tablet by mouth daily.     olmesartan (BENICAR) 40 MG tablet Take 1 tablet (40 mg total) by mouth daily. 30 tablet 2   terbinafine (LAMISIL) 250 MG tablet Take 1 tablet (250 mg total) by mouth daily. 30 tablet 0   tirzepatide (ZEPBOUND) 10 MG/0.5ML Pen Inject 10 mg into the skin once a week. 2 mL 0  tiZANidine (ZANAFLEX) 4 MG tablet tizanidine 4 mg tablet  TAKE 1 TABLET BY MOUTH EVERY 6 HOURS AS NEEDED FOR MUSCLE SPASM     No current facility-administered medications for this visit.     Assessment & Plan      Essential hypertension Assessment: BP is controlled in office BP 129/84 mmHg; slight diastolic elevation Home average in past 4 weeks 131/84 Tolerates chlorthalidone and olmesartan well without any side effects Currently taking electrolyte packet from nutritionist.  There is 210 mg sodium in each of these and < 2 mEq of potassium.  Recommend to hold for 3 week, as her current electrolyte levels are well WNL.  Sodium may be adding to fluid retention and/or BP.   Denies SOB, palpitation, chest pain, headaches,or swelling Reiterated the importance of regular exercise and low salt diet   Plan:  Stop taking electrolyte packets for now.  After 3 weeks off this, please go to the lab and get BMET.  Will determine if this is needed.  (If she develops cramping, she is welcome to add in calcium/magnesium/zinc tablets Continue taking olmesartan 40 mg every day, chlorthalidone 12.5 mg every day  Patient to keep record of BP readings with heart rate and report to us  at the next visit Patient to follow up with PharmD in 2 months  Labs ordered today: BMET ~ 2 weeks (after stopping electrolyte powder)    Donivan Furry PharmD CPP [facility]  [address] [place], [place] [address] [phone]  Addendum:  pt is only taking 12.5 mg chlorthalidone,  will leave her on that dose for now

## 2023-07-12 ENCOUNTER — Other Ambulatory Visit (HOSPITAL_COMMUNITY): Payer: Self-pay

## 2023-07-25 ENCOUNTER — Other Ambulatory Visit (HOSPITAL_COMMUNITY): Payer: Self-pay

## 2023-07-25 NOTE — Telephone Encounter (Signed)
Still denied

## 2023-07-30 ENCOUNTER — Other Ambulatory Visit: Payer: Self-pay | Admitting: Podiatrist

## 2023-08-08 ENCOUNTER — Other Ambulatory Visit: Payer: Self-pay | Admitting: Podiatrist

## 2023-08-09 ENCOUNTER — Other Ambulatory Visit (HOSPITAL_COMMUNITY): Payer: Self-pay

## 2023-08-09 ENCOUNTER — Other Ambulatory Visit: Payer: Self-pay

## 2023-08-09 ENCOUNTER — Encounter: Payer: Self-pay | Admitting: Pharmacist

## 2023-08-10 MED ORDER — TERBINAFINE HCL 250 MG PO TABS
250.0000 mg | ORAL_TABLET | Freq: Every day | ORAL | 0 refills | Status: DC
  Filled 2023-08-10: qty 30, 30d supply, fill #0

## 2023-08-11 ENCOUNTER — Other Ambulatory Visit: Payer: Self-pay

## 2023-08-11 ENCOUNTER — Other Ambulatory Visit (HOSPITAL_COMMUNITY): Payer: Self-pay

## 2023-08-13 ENCOUNTER — Other Ambulatory Visit (HOSPITAL_BASED_OUTPATIENT_CLINIC_OR_DEPARTMENT_OTHER): Payer: Self-pay | Admitting: Family

## 2023-08-13 DIAGNOSIS — I10 Essential (primary) hypertension: Secondary | ICD-10-CM

## 2023-08-14 ENCOUNTER — Other Ambulatory Visit: Payer: Self-pay

## 2023-08-16 ENCOUNTER — Other Ambulatory Visit: Payer: Self-pay

## 2023-08-21 ENCOUNTER — Encounter: Payer: Self-pay | Admitting: Family Medicine

## 2023-08-24 ENCOUNTER — Encounter (HOSPITAL_BASED_OUTPATIENT_CLINIC_OR_DEPARTMENT_OTHER): Payer: Self-pay

## 2023-08-24 ENCOUNTER — Encounter (HOSPITAL_BASED_OUTPATIENT_CLINIC_OR_DEPARTMENT_OTHER): Payer: Self-pay | Admitting: Cardiology

## 2023-08-24 ENCOUNTER — Encounter: Payer: Self-pay | Admitting: Family Medicine

## 2023-08-25 ENCOUNTER — Other Ambulatory Visit

## 2023-08-25 ENCOUNTER — Ambulatory Visit (INDEPENDENT_AMBULATORY_CARE_PROVIDER_SITE_OTHER)

## 2023-08-25 ENCOUNTER — Telehealth: Payer: Self-pay

## 2023-08-25 DIAGNOSIS — B351 Tinea unguium: Secondary | ICD-10-CM

## 2023-08-25 MED ORDER — TERBINAFINE HCL 250 MG PO TABS: 250.0000 mg | ORAL_TABLET | Freq: Every day | ORAL | 0 refills | Status: AC

## 2023-08-25 NOTE — Telephone Encounter (Signed)
 Patient is requesting refill on Lamisil . Per last note with Dr. Maybelle Spatz for laser treatment on 07/03/2023: Her next laser will be 3rd of 4-  will see how she is doing with lamisil  and will rx her last 30 days at that visit as long as she is still tolerating the medication well.      Please authorize refill if appropriate. Pharmacy has been updated in chart per patient request. Thank you.

## 2023-08-25 NOTE — Addendum Note (Signed)
 Addended by: Oveta Idris I on: 08/25/2023 10:50 AM   Modules accepted: Orders

## 2023-08-25 NOTE — Progress Notes (Signed)
 Patient presents today for the 3rd of 4 laser treatments. Diagnosed with mycotic nail infection by Dr. Lydia Sams.   Toenail most affected 1st bilaterally and left 5th.   All other systems are negative.  Affected Nails were filed thin. Laser therapy was administered to 1st bilaterally and left 5th and patient tolerated the treatment well. All safety precautions were in place.   Single laser pass performed on non-affected nails.   Follow up in 6 weeks for laser # 4 of 4.

## 2023-08-25 NOTE — Telephone Encounter (Signed)
 RX refill ordered per previous note from Dr. Maybelle Spatz.

## 2023-08-25 NOTE — Telephone Encounter (Signed)
Please assist pt with records request

## 2023-08-28 ENCOUNTER — Ambulatory Visit (INDEPENDENT_AMBULATORY_CARE_PROVIDER_SITE_OTHER): Admitting: Family Medicine

## 2023-08-28 ENCOUNTER — Encounter: Payer: Self-pay | Admitting: Family Medicine

## 2023-08-28 VITALS — BP 107/80 | HR 80 | Temp 98.2°F | Ht 67.5 in | Wt 303.2 lb

## 2023-08-28 DIAGNOSIS — Z91030 Bee allergy status: Secondary | ICD-10-CM | POA: Diagnosis not present

## 2023-08-28 DIAGNOSIS — Z6841 Body Mass Index (BMI) 40.0 and over, adult: Secondary | ICD-10-CM

## 2023-08-28 DIAGNOSIS — J4520 Mild intermittent asthma, uncomplicated: Secondary | ICD-10-CM

## 2023-08-28 DIAGNOSIS — I10 Essential (primary) hypertension: Secondary | ICD-10-CM

## 2023-08-28 DIAGNOSIS — E782 Mixed hyperlipidemia: Secondary | ICD-10-CM

## 2023-08-28 MED ORDER — EPINEPHRINE 0.3 MG/0.3ML IJ SOAJ: 0.3000 mg | INTRAMUSCULAR | 0 refills | Status: AC | PRN

## 2023-08-28 MED ORDER — ALBUTEROL SULFATE HFA 108 (90 BASE) MCG/ACT IN AERS
2.0000 | INHALATION_SPRAY | RESPIRATORY_TRACT | 2 refills | Status: AC | PRN
Start: 2023-08-28 — End: ?

## 2023-08-28 NOTE — Progress Notes (Signed)
 Subjective:  HPI: Carla [name] is a 43 y.o. female presenting on 08/28/2023 for Medical Management of Chronic Issues (3 month f/u )   HPI Patient is in today for chronic condition follow-up. She is being followed by cardiology at the hypertension clinic and BP is currently well controlled on Chlorthalidone  12.5mg  daily and Olmesartan  40mg  daily. Is continuing Zepbound  15mg  weekly and tolerating well. Is eating high protein, low calorie diet approx 1250-1500 calories daily. She is walking regularly for exercise and working with [facility]. She is closely monitoring her body composition and muscle mass.   HYPERTENSION without Chronic Kidney Disease Hypertension status: controlled  Satisfied with current treatment? yes Duration of hypertension: chronic BP monitoring frequency:  a few times a week BP range: <140/90 BP medication side effects:  no Medication compliance: excellent compliance Previous BP meds: chlorthalidone , olmesartan  Aspirin: no Recurrent headaches: no Visual changes: no Palpitations: no Dyspnea: no Chest pain: no Lower extremity edema: no Dizzy/lightheaded: yes  WEIGHT MANAGEMENT Starting BMI: 59.03 Current BMI: 46.79 Goal weight: ? Diet: low calorie Exercise: Moderate 30 min 3-4x/wk Mode: walking Medication: zepbound  15mg  weekly Side Effects: none   Review of Systems  All other systems reviewed and are negative.   Relevant past medical history reviewed and updated as indicated.   Past Medical History:  Diagnosis Date   Allergy    Asthma    Fibroids    Moderate obstructive sleep apnea    Obesity    Pregnancy induced hypertension      Past Surgical History:  Procedure Laterality Date   CERVICAL CERCLAGE  2003, 2008   CERVICAL CERCLAGE N/A 04/12/2016   Procedure: CERCLAGE CERVICAL;  Surgeon: Matt Song, MD;  Location: [facility];  Service: Gynecology;  Laterality: N/A;   CERVICAL CERCLAGE N/A 09/23/2016   Procedure: removal CERCLAGE CERVICAL;   Surgeon: Artemisa Bile, MD;  Location: [facility];  Service: Obstetrics;  Laterality: N/A;   CESAREAN SECTION N/A 09/23/2016   Procedure: CESAREAN SECTION;  Surgeon: Artemisa Bile, MD;  Location: [facility] BIRTHING SUITES;  Service: Obstetrics;  Laterality: N/A;  RNFA  Tracey T   DILATION AND CURETTAGE OF UTERUS     ectopic     MYOMECTOMY  2016   fibroid removal    Allergies and medications reviewed and updated.   Current Outpatient Medications:    cetirizine  (ZYRTEC ) 10 MG tablet, Take 1 tablet (10 mg total) by mouth daily as needed for allergies., Disp: 30 tablet, Rfl: 11   chlorthalidone  (HYGROTON ) 25 MG tablet, TAKE 1/2 TABLET BY MOUTH EVERY DAY, Disp: 45 tablet, Rfl: 1   cholecalciferol (VITAMIN D3) 25 MCG (1000 UNIT) tablet, Take 1 tablet (1,000 Units total) by mouth daily., Disp: 90 tablet, Rfl: 0   cyclobenzaprine  (FLEXERIL ) 10 MG tablet, Take 1 tablet (10 mg total) by mouth 3 (three) times daily as needed for muscle spasms., Disp: 30 tablet, Rfl: 0   EPINEPHrine  0.3 mg/0.3 mL IJ SOAJ injection, Inject 0.3 mg into the muscle as needed for anaphylaxis., Disp: 1 each, Rfl: 0   fluticasone  (FLONASE ) 50 MCG/ACT nasal spray, Place 1 spray into both nostrils daily., Disp: 16 g, Rfl: 6   Multiple Vitamin (MULTIVITAMIN WITH MINERALS) TABS tablet, Take 1 tablet by mouth daily., Disp: , Rfl:    olmesartan  (BENICAR ) 40 MG tablet, TAKE 1 TABLET BY MOUTH EVERY DAY, Disp: 30 tablet, Rfl: 9   terbinafine  (LAMISIL ) 250 MG tablet, Take 1 tablet (250 mg total) by mouth daily., Disp: 30 tablet, Rfl: 0  tirzepatide  (ZEPBOUND ) 10 MG/0.5ML Pen, Inject 10 mg into the skin once a week., Disp: 2 mL, Rfl: 0   tiZANidine  (ZANAFLEX ) 4 MG tablet, tizanidine  4 mg tablet  TAKE 1 TABLET BY MOUTH EVERY 6 HOURS AS NEEDED FOR MUSCLE SPASM, Disp: , Rfl:    albuterol  (VENTOLIN  HFA) 108 (90 Base) MCG/ACT inhaler, Inhale 2 puffs into the lungs every 4 (four) hours as needed for wheezing or shortness of breath (before  exercise)., Disp: 18 g, Rfl: 2  Allergies  Allergen Reactions   Penicillins Hives and Nausea And Vomiting    Has patient had a PCN reaction causing immediate rash, facial/tongue/throat swelling, SOB or lightheadedness with hypotension: No Has patient had a PCN reaction causing severe rash involving mucus membranes or skin necrosis: Yes Has patient had a PCN reaction that required hospitalization No Has patient had a PCN reaction occurring within the last 10 years: No If all of the above answers are "NO", then may proceed with Cephalosporin use.    Latex Rash and Dermatitis    Objective:   BP 107/80   Pulse 80   Temp 98.2 F (36.8 C)   Ht 5' 7.5" (1.715 m)   Wt (!) 303 lb 3.2 oz (137.5 kg)   LMP 08/18/2023   SpO2 97%   BMI 46.79 kg/m      08/28/2023    9:44 AM 07/10/2023    8:39 AM 05/29/2023    9:15 AM  Vitals with BMI  Height 5' 7.5" 5' 7.5"   Weight 303 lbs 3 oz 320 lbs 3 oz   BMI 46.76 49.38   Systolic 107 129 161  Diastolic 80 84 88  Pulse 80 78      Physical Exam Vitals and nursing note reviewed.  Constitutional:      Appearance: Normal appearance. She is obese.  HENT:     Head: Normocephalic and atraumatic.  Cardiovascular:     Rate and Rhythm: Normal rate and regular rhythm.     Pulses: Normal pulses.     Heart sounds: Normal heart sounds.  Pulmonary:     Effort: Pulmonary effort is normal.     Breath sounds: Normal breath sounds.  Skin:    General: Skin is warm and dry.  Neurological:     General: No focal deficit present.     Mental Status: She is alert and oriented to person, place, and time. Mental status is at baseline.  Psychiatric:        Mood and Affect: Mood normal.        Behavior: Behavior normal.        Thought Content: Thought content normal.        Judgment: Judgment normal.     Assessment & Plan:  Bee sting allergy  Mild intermittent asthma without complication Assessment & Plan: Well controlled without exacerbations. Seasonal  albuterol  use. Refill provided for albuterol  and epi-pen  Orders: -     Albuterol  Sulfate HFA; Inhale 2 puffs into the lungs every 4 (four) hours as needed for wheezing or shortness of breath (before exercise).  Dispense: 18 g; Refill: 2  Essential hypertension Assessment & Plan: Well controlled on Olmesartan  40mg  daily and Chlorthalidone  12.5mg  daily without side effects. Recommend heart healthy diet such as Mediterranean diet with whole grains, fruits, vegetable, fish, lean meats, nuts, and olive oil. Limit salt. Encouraged moderate walking, 3-5 times/week for 30-50 minutes each session. Aim for at least 150 minutes.week. Goal should be pace of 3 miles/hours, or walking  1.5 miles in 30 minutes. Avoid tobacco products. Avoid excess alcohol. Take medications as prescribed and bring medications and blood pressure log with cuff to each office visit. Seek medical care for chest pain, palpitations, shortness of breath with exertion, dizziness/lightheadedness, vision changes, recurrent headaches, or swelling of extremities. Keep appts with cardiology and follow up in 6 months or sooner if needed.  Orders: -     Comprehensive metabolic panel with GFR -     Lipid panel -     VITAMIN D  25 Hydroxy (Vit-D Deficiency, Fractures)  Morbid obesity (HCC) Assessment & Plan: Continue Zepbound  15mg  weekly. Tolerating well. Using [employer]. Follow up in 3 months or sooner if needed.   Orders: -     Comprehensive metabolic panel with GFR -     Lipid panel -     VITAMIN D  25 Hydroxy (Vit-D Deficiency, Fractures)  Moderate mixed hyperlipidemia not requiring statin therapy Assessment & Plan: I recommend consuming a heart healthy diet such as Mediterranean diet or DASH diet with whole grains, fruits, vegetable, fish, lean meats, nuts, and olive oil. Limit sweets and processed foods. I also encourage moderate intensity exercise 150 minutes weekly. This is 3-5 times weekly for 30-50 minutes each session.  Goal should be pace of 3 miles/hours, or walking 1.5 miles in 30 minutes. The 10-year ASCVD risk score (Arnett DK, et al., 2019) is: 0.3% Repeat labs today  Orders: -     Comprehensive metabolic panel with GFR -     Lipid panel -     VITAMIN D  25 Hydroxy (Vit-D Deficiency, Fractures)  Other orders -     EPINEPHrine ; Inject 0.3 mg into the muscle as needed for anaphylaxis.  Dispense: 1 each; Refill: 0     Follow up plan: Return in about 3 months (around 11/28/2023) for hypertension, weight management.  Jenelle Mis, FNP

## 2023-08-28 NOTE — Assessment & Plan Note (Signed)
 Well controlled on Olmesartan  40mg  daily and Chlorthalidone  12.5mg  daily without side effects. Recommend heart healthy diet such as Mediterranean diet with whole grains, fruits, vegetable, fish, lean meats, nuts, and olive oil. Limit salt. Encouraged moderate walking, 3-5 times/week for 30-50 minutes each session. Aim for at least 150 minutes.week. Goal should be pace of 3 miles/hours, or walking 1.5 miles in 30 minutes. Avoid tobacco products. Avoid excess alcohol. Take medications as prescribed and bring medications and blood pressure log with cuff to each office visit. Seek medical care for chest pain, palpitations, shortness of breath with exertion, dizziness/lightheadedness, vision changes, recurrent headaches, or swelling of extremities. Keep appts with cardiology and follow up in 6 months or sooner if needed.

## 2023-08-28 NOTE — Assessment & Plan Note (Addendum)
 I recommend consuming a heart healthy diet such as Mediterranean diet or DASH diet with whole grains, fruits, vegetable, fish, lean meats, nuts, and olive oil. Limit sweets and processed foods. I also encourage moderate intensity exercise 150 minutes weekly. This is 3-5 times weekly for 30-50 minutes each session. Goal should be pace of 3 miles/hours, or walking 1.5 miles in 30 minutes. The 10-year ASCVD risk score (Arnett DK, et al., 2019) is: 0.3% Repeat labs today

## 2023-08-28 NOTE — Assessment & Plan Note (Signed)
 Continue Zepbound  15mg  weekly. Tolerating well. Using [employer]. Follow up in 3 months or sooner if needed.

## 2023-08-28 NOTE — Assessment & Plan Note (Signed)
 Well controlled without exacerbations. Seasonal albuterol  use. Refill provided for albuterol  and epi-pen

## 2023-08-29 ENCOUNTER — Ambulatory Visit: Payer: Self-pay | Admitting: Family Medicine

## 2023-08-29 LAB — COMPREHENSIVE METABOLIC PANEL WITH GFR
AG Ratio: 1.6 (calc) (ref 1.0–2.5)
ALT: 15 U/L (ref 6–29)
AST: 17 U/L (ref 10–30)
Albumin: 4.1 g/dL (ref 3.6–5.1)
Alkaline phosphatase (APISO): 48 U/L (ref 31–125)
BUN: 16 mg/dL (ref 7–25)
CO2: 29 mmol/L (ref 20–32)
Calcium: 9.3 mg/dL (ref 8.6–10.2)
Chloride: 104 mmol/L (ref 98–110)
Creat: 0.85 mg/dL (ref 0.50–0.99)
Globulin: 2.5 g/dL (ref 1.9–3.7)
Glucose, Bld: 87 mg/dL (ref 65–99)
Potassium: 3.9 mmol/L (ref 3.5–5.3)
Sodium: 141 mmol/L (ref 135–146)
Total Bilirubin: 0.3 mg/dL (ref 0.2–1.2)
Total Protein: 6.6 g/dL (ref 6.1–8.1)
eGFR: 88 mL/min/{1.73_m2} (ref >=60)

## 2023-08-29 LAB — LIPID PANEL
Cholesterol: 192 mg/dL (ref <200)
HDL: 77 mg/dL (ref >=50)
LDL Cholesterol (Calc): 101 mg/dL — ABNORMAL HIGH
Non-HDL Cholesterol (Calc): 115 mg/dL (ref <130)
Total CHOL/HDL Ratio: 2.5 (calc) (ref <5.0)
Triglycerides: 53 mg/dL (ref <150)

## 2023-08-29 LAB — VITAMIN D 25 HYDROXY (VIT D DEFICIENCY, FRACTURES): Vit D, 25-Hydroxy: 33 ng/mL (ref 30–100)

## 2023-09-12 ENCOUNTER — Ambulatory Visit (HOSPITAL_BASED_OUTPATIENT_CLINIC_OR_DEPARTMENT_OTHER): Attending: Cardiology | Admitting: Cardiology

## 2023-09-12 VITALS — Ht 67.0 in | Wt 308.0 lb

## 2023-09-12 DIAGNOSIS — I10 Essential (primary) hypertension: Secondary | ICD-10-CM | POA: Insufficient documentation

## 2023-09-12 DIAGNOSIS — Z6841 Body Mass Index (BMI) 40.0 and over, adult: Secondary | ICD-10-CM | POA: Insufficient documentation

## 2023-09-12 DIAGNOSIS — R400 Somnolence: Secondary | ICD-10-CM | POA: Diagnosis present

## 2023-09-12 DIAGNOSIS — G4733 Obstructive sleep apnea (adult) (pediatric): Secondary | ICD-10-CM | POA: Insufficient documentation

## 2023-09-12 DIAGNOSIS — E782 Mixed hyperlipidemia: Secondary | ICD-10-CM | POA: Insufficient documentation

## 2023-09-12 NOTE — Progress Notes (Signed)
 Office Visit    Patient Name: Carla [name] Date of Encounter: 09/13/2023  Primary Care Provider:  Jenelle Mis, FNP Primary Cardiologist:  None  Chief Complaint    Hypertension - Advanced hypertension clinic  Past Medical History   obesity BMI 49.38; on tirzepatide , was getting compounded, now buying from [employer]  asthma Cetirizine , fluticasone , prn albuterol     Allergies  Allergen Reactions   Penicillins Hives and Nausea And Vomiting    Has patient had a PCN reaction causing immediate rash, facial/tongue/throat swelling, SOB or lightheadedness with hypotension: No Has patient had a PCN reaction causing severe rash involving mucus membranes or skin necrosis: Yes Has patient had a PCN reaction that required hospitalization No Has patient had a PCN reaction occurring within the last 10 years: No If all of the above answers are NO, then may proceed with Cephalosporin use.    Latex Rash and Dermatitis    History of Present Illness    Carla [name] is a 43 y.o. female patient who was referred to the [facility] by Yolanda Hence FNP.  Patient noted that her blood pressure was elevated during her second pregnancy, but did not develop preeclampisa.  Readings fluctuated for several years, improving after rest, but have been more consistently elevated for the past 6-12 months.  Her BP at that visit with Neomi Banks was 173/110 on losartan  100 mg.  This was switched to olmesartan , and secondary causes were reviewed.  She had lost some weight with compounded tirzepetide, but is no longer able to get that.  Her insurance denied coverage for Zepbound , even with the OSA diagnosis.   At her last visit with me in April, she was doing well, averaging 127-134 systolic averages over the previous weeks.  Diastolic still slightly elevated at 80-88.  Had her discontinue electrolyte pack that a nutritionist had prescribed, as has 210 mg sodium per serving.  Labs drawn in  early June showed electrolytes and renal function WNL  Today she returns for follow up.  Her home BP readings have been doing well, mostly < 130 with only occasional spikes that come back down within a day or two.  She did an overnight sleep study at [facility] last night, so didn't get much sleep.  States she felt a little weak when her pressure dropped to 105 systolic, ate a few salted nuts and felt better.  She also says that she and her husband are considering having another child and she wants to know about medication safety should they decide to try.    Blood Pressure Goal:  130/80  Current Medications: olmesartan  40 mg every day, chlorthalidone  12.5 mg every day  - both at hs  Adherence Assessment  Do you ever forget to take your medication? [x] Yes [] No  Do you ever skip doses due to side effects? [] Yes [x] No  Do you have trouble affording your medicines? [x] Yes - Zepbound  - cash [] No  Are you ever unable to pick up your medication due to transportation difficulties? [] Yes [x] No   Adherence strategy: 7 day pill minder  Previously tried:   Amlodipine - cough, swelling Lisinopril  - swelling Hydrochlorothiazide - dizzy, lightheaded, nausea  Family Hx:   mother with hypertension (also seen with our clinic) still struggling; father deceased at 52 - accidental; siblings healthy; 2 kids 12, 6, daughter (79) no BP issues  Social Hx:      Tobacco: no  Alcohol: 1-2 times per week  Caffeine: aobut once weekly -  coffee   Diet:  mostly home cooked meals, does eat out a few times per month; working with nutritionist    Exercise: walks on [place] ([employer])  Home BP readings:  newer Omron cuff, has app on phone that shows weekly averages - most recent is 120/75 for the past week.    Accessory Clinical Findings    Lab Results  Component Value Date   CREATININE 0.85 08/28/2023   BUN 16 08/28/2023   NA 141 08/28/2023   K 3.9 08/28/2023   CL 104 08/28/2023   CO2 29 08/28/2023   Lab  Results  Component Value Date   ALT 15 08/28/2023   AST 17 08/28/2023   ALKPHOS 56 09/23/2016   BILITOT 0.3 08/28/2023   Lab Results  Component Value Date   HGBA1C 5.9 (H) 12/01/2022    Screening for Secondary Hypertension:      05/18/2023    5:32 PM  Causes  Drugs/Herbals Screened     - Comments no excessive caffeine  Renovascular HTN Screened     - Comments 04/2023 renal duplex ordered  Sleep Apnea Screened     - Comments 04/2023 itamar ordered  Thyroid Disease Screened     - Comments normal TSH 11/2022  Hyperaldosteronism Screened     - Comments 04/2023 renin-aldo ordered  Pheochromocytoma N/A     - Comments normal adrenal by prior CT  Cushing's Syndrome Screened     - Comments 04/2023 cortisol ordered  Coarctation of the Aorta N/A     - Comments bp symmetrical    Relevant Labs/Studies:    Latest Ref Rng & Units 08/28/2023   10:17 AM 05/31/2023   10:51 AM 04/06/2023   11:28 AM  Basic Labs  Sodium 135 - 146 mmol/L 141  142  138   Potassium 3.5 - 5.3 mmol/L 3.9  4.4  4.8   Creatinine 0.50 - 0.99 mg/dL 8.29  5.62  1.30        Latest Ref Rng & Units 12/01/2022   10:05 AM 06/02/2015    3:09 PM  Thyroid   TSH mIU/L 2.46  2.34        Latest Ref Rng & Units 05/31/2023   10:51 AM  Renin/Aldosterone   Aldosterone 0.0 - 30.0 ng/dL 86.5   Aldos/Renin Ratio 0.0 - 30.0 2.6              06/19/2023   12:32 PM  Renovascular   Renal Artery US  Completed Yes      Home Medications    Current Outpatient Medications  Medication Sig Dispense Refill   albuterol  (VENTOLIN  HFA) 108 (90 Base) MCG/ACT inhaler Inhale 2 puffs into the lungs every 4 (four) hours as needed for wheezing or shortness of breath (before exercise). 18 g 2   cetirizine  (ZYRTEC ) 10 MG tablet Take 1 tablet (10 mg total) by mouth daily as needed for allergies. 30 tablet 11   chlorthalidone  (HYGROTON ) 25 MG tablet TAKE 1/2 TABLET BY MOUTH EVERY DAY 45 tablet 1   cholecalciferol (VITAMIN D3) 25 MCG (1000 UNIT) tablet  Take 1 tablet (1,000 Units total) by mouth daily. 90 tablet 0   cyclobenzaprine  (FLEXERIL ) 10 MG tablet Take 1 tablet (10 mg total) by mouth 3 (three) times daily as needed for muscle spasms. 30 tablet 0   EPINEPHrine  0.3 mg/0.3 mL IJ SOAJ injection Inject 0.3 mg into the muscle as needed for anaphylaxis. 1 each 0   fluticasone  (FLONASE ) 50 MCG/ACT nasal spray Place 1 spray  into both nostrils daily. 16 g 6   Multiple Vitamin (MULTIVITAMIN WITH MINERALS) TABS tablet Take 1 tablet by mouth daily.     olmesartan  (BENICAR ) 40 MG tablet TAKE 1 TABLET BY MOUTH EVERY DAY 30 tablet 9   terbinafine  (LAMISIL ) 250 MG tablet Take 1 tablet (250 mg total) by mouth daily. 30 tablet 0   tiZANidine  (ZANAFLEX ) 4 MG tablet tizanidine  4 mg tablet  TAKE 1 TABLET BY MOUTH EVERY 6 HOURS AS NEEDED FOR MUSCLE SPASM     ZEPBOUND  15 MG/0.5ML Pen Inject 15 mg into the skin once a week.     No current facility-administered medications for this visit.     Assessment & Plan      Essential hypertension Assessment: BP is controlled in office BP 116/80 mmHg. Tolerates chlorthalidone  and olmesartan  well, without any side effects Denies SOB, palpitation, chest pain, headaches,or swelling Reiterated the importance of regular exercise and low salt diet   Plan:  Continue taking chlorthalidone  and olmesartan  Patient to keep record of BP readings with heart rate and report to us  at the next visit Patient to follow up with me or Caitlin in 6 months  Labs ordered today:  none Should she decide to pursue pregnancy, she is aware to call the office as we would need to switch her BP medications   Aida Lemaire PharmD CPP [facility]  [address] [place], [place] [address] [phone]

## 2023-09-13 ENCOUNTER — Encounter (HOSPITAL_BASED_OUTPATIENT_CLINIC_OR_DEPARTMENT_OTHER): Payer: Self-pay

## 2023-09-13 ENCOUNTER — Ambulatory Visit (INDEPENDENT_AMBULATORY_CARE_PROVIDER_SITE_OTHER): Admitting: Pharmacist Clinician (PhC)/ Clinical Pharmacy Specialist

## 2023-09-13 VITALS — BP 116/80 | HR 76 | Ht 67.0 in | Wt 304.0 lb

## 2023-09-13 DIAGNOSIS — I10 Essential (primary) hypertension: Secondary | ICD-10-CM | POA: Diagnosis not present

## 2023-09-13 NOTE — Patient Instructions (Signed)
 Follow up appointment: with Khian Remo or Caitlin in 6 months  Take your BP meds as follows: continue with olmesartan  and chlorthalidone  for now.  If you decide to pursue pregnancy, please let us  know, as the BP medications are not safe for pregnancy.    Check your blood pressure at home daily (if able) and keep record of the readings.  Your blood pressure goal is < 130/80  To check your pressure at home you will need to:  1. Sit up in a chair, with feet flat on the floor and back supported. Do not cross your ankles or legs. 2. Rest your left arm so that the cuff is about heart level. If the cuff goes on your upper arm,  then just relax the arm on the table, arm of the chair or your lap. If you have a wrist cuff, we  suggest relaxing your wrist against your chest (think of it as Pledging the Flag with the  wrong arm).  3. Place the cuff snugly around your arm, about 1 inch above the crook of your elbow. The  cords should be inside the groove of your elbow.  4. Sit quietly, with the cuff in place, for about 5 minutes. After that 5 minutes press the power  button to start a reading. 5. Do not talk or move while the reading is taking place.  6. Record your readings on a sheet of paper. Although most cuffs have a memory, it is often  easier to see a pattern developing when the numbers are all in front of you.  7. You can repeat the reading after 1-3 minutes if it is recommended  Make sure your bladder is empty and you have not had caffeine or tobacco within the last 30 min  Always bring your blood pressure log with you to your appointments. If you have not brought your monitor in to be double checked for accuracy, please bring it to your next appointment.  You can find a list of quality blood pressure cuffs at [email or web address]  Important lifestyle changes to control high blood pressure  Intervention  Effect on the BP  Lose extra pounds and watch your waistline Weight loss is one of the  most effective lifestyle changes for controlling blood pressure. If you're overweight or obese, losing even a small amount of weight can help reduce blood pressure. Blood pressure might go down by about 1 millimeter of mercury (mm Hg) with each kilogram (about 2.2 pounds) of weight lost.  Exercise regularly As a general goal, aim for at least 30 minutes of moderate physical activity every day. Regular physical activity can lower high blood pressure by about 5 to 8 mm Hg.  Eat a healthy diet Eating a diet rich in whole grains, fruits, vegetables, and low-fat dairy products and low in saturated fat and cholesterol. A healthy diet can lower high blood pressure by up to 11 mm Hg.  Reduce salt (sodium) in your diet Even a small reduction of sodium in the diet can improve heart health and reduce high blood pressure by about 5 to 6 mm Hg.  Limit alcohol One drink equals 12 ounces of beer, 5 ounces of wine, or 1.5 ounces of 80-proof liquor.  Limiting alcohol to less than one drink a day for women or two drinks a day for men can help lower blood pressure by about 4 mm Hg.   If you have any questions or concerns please use My Chart to send questions or call  the office at [phone]

## 2023-09-13 NOTE — Assessment & Plan Note (Signed)
 Assessment: BP is controlled in office BP 116/80 mmHg. Tolerates chlorthalidone  and olmesartan  well, without any side effects Denies SOB, palpitation, chest pain, headaches,or swelling Reiterated the importance of regular exercise and low salt diet   Plan:  Continue taking chlorthalidone  and olmesartan  Patient to keep record of BP readings with heart rate and report to us  at the next visit Patient to follow up with me or Caitlin in 6 months  Labs ordered today:  none Should she decide to pursue pregnancy, she is aware to call the office as we would need to switch her BP medications

## 2023-09-18 NOTE — Procedures (Signed)
   [facility] [address] [place], [place] [address] Tel: [phone]   Fax: [phone]  Titration Interpretation  Patient Name:  Carla [name], Carla [name] Date:  09/12/2023 Referring Physician:  Dr. Wilbert Bihari  Indications for Polysomnography The patient is a 43 year old - who is 5' 7 and weighs 308.0 lbs. His/her BMI equals 48.3.  A full night titration treatment study was performed.  Medications were reported taken at 8:56 pm.  Terbinafine   Olmesartan   Vitamin- D  Chlorthalidone    Polysomnogram Data A full night polysomnogram recorded the standard physiologic parameters including EEG, EOG, EMG, EKG, nasal and oral airflow.  Respiratory parameters of chest and abdominal movements were recorded with Respiratory Inductance Plethysmography belts.  Oxygen saturation was recorded by pulse oximetry.   Sleep Architecture The total recording time of the polysomnogram was 366.4 minutes.  The total sleep time was 228.5 minutes.  The patient spent 13.1% of total sleep time in Stage N1, 69.1% in Stage N2, 4.2% in Stages N3, and 13.6% in REM.  Sleep latency was 8.8 minutes.  REM latency was 45.5 minutes.  Sleep Efficiency was 62.4%.  Wake after Sleep Onset time was 129.0 minutes.  Titration Summary The patient was titrated at pressures ranging from 6 cm/H20 up to 17 cm/H20. The last pressure used in the study was 17 cm/H20.  Respiratory Events The polysomnogram revealed a presence of 0 obstructive, 0 central, and 0 mixed apneas resulting in an Apnea index of 0 events per hour.  There were 47 hypopneas (>=3% desaturation and/or arousal) resulting in an Apnea\Hypopnea Index (AHI >=3% desaturation and/or arousal) of 12.3 events per hour.  There were 23 hypopneas (>=4% desaturation) resulting in an Apnea\Hypopnea Index (AHI >=4% desaturation) of 6.0 events per hour.  There were 23 Respiratory Effort Related Arousals resulting in a RERA index of 6.0 events per  hour. The Respiratory Disturbance Index is 18.4 events per hour.  The snore index was 0 events per hour.  Mean oxygen saturation was 95.0%.  The lowest oxygen saturation during sleep was 88.0%.  Time spent <=88% oxygen saturation was 0.1 minutes.  Limb Activity There were 0 limb movements recorded.    Cardiac Summary The average pulse rate was 75.7 bpm.  The minimum pulse rate was 65.0 bpm while the maximum pulse rate was 100.0 bpm.  Cardiac rhythm was normal with rare PVCs.  Diagnosis:  Obstructive Sleep Apnea Successful CPAP titration  Recommendations: Recommend a trial of ResMed CPAP at 17cm H2O with heated humidity and medium [employer] Simplus full face mask. The patient should be counseled in good sleep hygiene and to avoid sleeping in the supine position. Encourage patient to avoid driving when sleepy. Followup in Sleep Clinic in 6 weeks.   This study was personally reviewed and electronically signed by: Dr. Wilbert Bihari Accredited Board Certified in Sleep Medicine Date/Time: 09/18/2023 7:17PM

## 2023-09-21 ENCOUNTER — Other Ambulatory Visit: Payer: Self-pay | Admitting: Podiatrist

## 2023-10-02 ENCOUNTER — Encounter: Payer: Self-pay | Admitting: Family Medicine

## 2023-10-03 ENCOUNTER — Other Ambulatory Visit: Payer: Self-pay

## 2023-10-03 MED ORDER — ZEPBOUND 15 MG/0.5ML ~~LOC~~ SOAJ
15.0000 mg | SUBCUTANEOUS | 3 refills | Status: DC
Start: 2023-10-03 — End: 2023-12-04

## 2023-10-09 ENCOUNTER — Encounter: Payer: Self-pay | Admitting: Pharmacist Clinician (PhC)/ Clinical Pharmacy Specialist

## 2023-10-10 ENCOUNTER — Other Ambulatory Visit

## 2023-10-11 ENCOUNTER — Telehealth: Payer: Self-pay | Admitting: *Deleted

## 2023-10-11 NOTE — Telephone Encounter (Signed)
 The patient has been notified of the result and verbalized understanding.  All questions (if any) were answered. Carla [name], CMA 10/11/2023 12:33 PM    Upon patient request DME selection is [facility] Patient understands he will be contacted by [facility] to set up his cpap. Patient understands to call if [facility] does not contact him with new setup in a timely manner. Patient understands they will be called once confirmation has been received from [facility] that they have received their new machine to schedule 10 week follow up appointment.   [facility] notified of new cpap order  Please add to airview Patient was grateful for the call and thanked me.

## 2023-10-11 NOTE — Telephone Encounter (Signed)
-----   Message from Wilbert Bihari sent at 09/18/2023  7:19 PM EDT ----- Please let patient know that they had a successful PAP titration and let DME know that orders are in EPIC.  Please set up 6 week OV with me.

## 2023-11-06 ENCOUNTER — Telehealth: Payer: Self-pay | Admitting: Family Medicine

## 2023-11-06 ENCOUNTER — Other Ambulatory Visit: Payer: Self-pay

## 2023-11-06 DIAGNOSIS — I10 Essential (primary) hypertension: Secondary | ICD-10-CM

## 2023-11-06 MED ORDER — CHLORTHALIDONE 25 MG PO TABS: 12.5000 mg | ORAL_TABLET | Freq: Every day | ORAL | 1 refills | Status: DC

## 2023-11-06 NOTE — Telephone Encounter (Signed)
 Prescription Request  11/06/2023  LOV: 08/28/2023  What is the name of the medication or equipment?   chlorthalidone  (HYGROTON ) 25 MG tablet  **new prescription requested**  Have you contacted your pharmacy to request a refill? Yes   Which pharmacy would you like this sent to? No Pharmacies Listed   Patient notified that their request is being sent to the clinical staff for review and that they should receive a response within 2 business days.   Please advise pharmacist.

## 2023-12-04 ENCOUNTER — Encounter: Payer: Self-pay | Admitting: Family Medicine

## 2023-12-04 ENCOUNTER — Ambulatory Visit: Admitting: Family Medicine

## 2023-12-04 VITALS — BP 132/84 | HR 78 | Temp 98.5°F | Ht 67.0 in | Wt 301.8 lb

## 2023-12-04 DIAGNOSIS — G4733 Obstructive sleep apnea (adult) (pediatric): Secondary | ICD-10-CM | POA: Diagnosis not present

## 2023-12-04 DIAGNOSIS — I10 Essential (primary) hypertension: Secondary | ICD-10-CM

## 2023-12-04 DIAGNOSIS — Z6841 Body Mass Index (BMI) 40.0 and over, adult: Secondary | ICD-10-CM

## 2023-12-04 MED ORDER — ZEPBOUND 15 MG/0.5ML ~~LOC~~ SOAJ: 15.0000 mg | SUBCUTANEOUS | 3 refills | Status: DC

## 2023-12-04 NOTE — Assessment & Plan Note (Signed)
 Continue Zepbound  15mg  weekly. Cost is barrier for continuing. Will work with PA team to try to get this covered given her multitude of medical conditions including morbid obesity, OSA, prediabetes, HTN, HLD. Zepbound  has been approved for use for OSA.

## 2023-12-04 NOTE — Assessment & Plan Note (Signed)
 Well controlled on Olmesartan  40mg  daily and Chlorthalidone  12.5mg  daily. Recommend heart healthy diet such as Mediterranean diet with whole grains, fruits, vegetable, fish, lean meats, nuts, and olive oil. Limit salt. Encouraged moderate walking, 3-5 times/week for 30-50 minutes each session. Aim for at least 150 minutes.week. Goal should be pace of 3 miles/hours, or walking 1.5 miles in 30 minutes. Avoid tobacco products. Avoid excess alcohol. Take medications as prescribed and bring medications and blood pressure log with cuff to each office visit. Seek medical care for chest pain, palpitations, shortness of breath with exertion, dizziness/lightheadedness, vision changes, recurrent headaches, or swelling of extremities. Follow up for CPE in 4 months

## 2023-12-04 NOTE — Progress Notes (Signed)
 Subjective:  HPI: Carla [name] is a 43 y.o. female presenting on 12/04/2023 for Medical Management of Chronic Issues (3 months f/u for HTN and wt management )   HPI Patient is in today for chronic condition management. PMH includes asthma, allergies, OSA, HTN, HLD, prediabetes, obesity. She is being followed by cardiology at the hypertension clinic and BP is currently well controlled on Chlorthalidone  12.5mg  daily and Olmesartan  40mg  daily. Is continuing Zepbound  15mg  weekly and tolerating well. Cost is high so her OBGYN ordered Wegovy . Is eating high protein, low calorie diet approx 1250-1500 calories daily. She is walking regularly for exercise and working with [facility]. She is closely monitoring her body composition and muscle mass.   OSA: tolerating CPAP, on Zepbound  for added benefits, needs insurance coverage for affordability  HTN: BP well controlled on current regimen  Prediabetes: A1c 5.9%, Zepbound  15mg  weekly  WEIGHT MANAGEMENT Starting BMI: 59.03 Current BMI: 47.27 Goal weight: 250 Diet: low calorie Exercise: Moderate 30 min 3-4x/wk Mode: walking Medication: zepbound  15mg  weekly Side Effects: none  HYPERTENSION without Chronic Kidney Disease Hypertension status: controlled  Satisfied with current treatment? yes Duration of hypertension: chronic BP monitoring frequency:  a few times a week BP range:  BP medication side effects:  no Medication compliance: excellent compliance Previous BP meds:none, amlodipine, amlodipine/benazepril, chlorthalidone , and olmesartan  (benicar ) Aspirin: no Recurrent headaches: no Visual changes: no Palpitations: no Dyspnea: no Chest pain: no Lower extremity edema: no Dizzy/lightheaded: no  The 10-year ASCVD risk score (Arnett DK, et al., 2019) is: 1%   Values used to calculate the score:     Age: 35 years     Clincally relevant sex: Female     Is Non-Hispanic African American: Yes     Diabetic: No     Tobacco smoker: No     Systolic  Blood Pressure: 132 mmHg     Is BP treated: Yes     HDL Cholesterol: 77 mg/dL     Total Cholesterol: 192 mg/dL   Review of Systems  All other systems reviewed and are negative.   Relevant past medical history reviewed and updated as indicated.   Past Medical History:  Diagnosis Date   Allergy    Asthma    Fibroids    Moderate obstructive sleep apnea    Obesity    Pregnancy induced hypertension      Past Surgical History:  Procedure Laterality Date   CERVICAL CERCLAGE  2003, 2008   CERVICAL CERCLAGE N/A 04/12/2016   Procedure: CERCLAGE CERVICAL;  Surgeon: Rosaline Luna, MD;  Location: [facility];  Service: Gynecology;  Laterality: N/A;   CERVICAL CERCLAGE N/A 09/23/2016   Procedure: removal CERCLAGE CERVICAL;  Surgeon: Bettina Muskrat, MD;  Location: [facility] BIRTHING SUITES;  Service: Obstetrics;  Laterality: N/A;   CESAREAN SECTION N/A 09/23/2016   Procedure: CESAREAN SECTION;  Surgeon: Bettina Muskrat, MD;  Location: [facility] BIRTHING SUIT[facility];  Service: Obstetrics;  Laterality: N/A;  RNFA  Tracey T   DILATION AND CURETTAGE OF UTERUS     ectopic     MYOMECTOMY  2016   fibroid removal    Allergies and medications reviewed and updated.   Current Outpatient Medications:    albuterol  (VENTOLIN  HFA) 108 (90 Base) MCG/ACT inhaler, Inhale 2 puffs into the lungs every 4 (four) hours as needed for wheezing or shortness of breath (before exercise)., Disp: 18 g, Rfl: 2   cetirizine  (ZYRTEC ) 10 MG tablet, Take 1 tablet (10 mg total) by mouth daily as needed for allergies.,  Disp: 30 tablet, Rfl: 11   chlorthalidone  (HYGROTON ) 25 MG tablet, Take 0.5 tablets (12.5 mg total) by mouth daily., Disp: 45 tablet, Rfl: 1   cholecalciferol (VITAMIN D3) 25 MCG (1000 UNIT) tablet, Take 1 tablet (1,000 Units total) by mouth daily., Disp: 90 tablet, Rfl: 0   cyclobenzaprine  (FLEXERIL ) 10 MG tablet, Take 1 tablet (10 mg total) by mouth 3 (three) times daily as needed for muscle spasms., Disp: 30 tablet, Rfl: 0    EPINEPHrine  0.3 mg/0.3 mL IJ SOAJ injection, Inject 0.3 mg into the muscle as needed for anaphylaxis., Disp: 1 each, Rfl: 0   fluticasone  (FLONASE ) 50 MCG/ACT nasal spray, Place 1 spray into both nostrils daily., Disp: 16 g, Rfl: 6   Multiple Vitamin (MULTIVITAMIN WITH MINERALS) TABS tablet, Take 1 tablet by mouth daily., Disp: , Rfl:    olmesartan  (BENICAR ) 40 MG tablet, TAKE 1 TABLET BY MOUTH EVERY DAY, Disp: 30 tablet, Rfl: 9   tiZANidine  (ZANAFLEX ) 4 MG tablet, tizanidine  4 mg tablet  TAKE 1 TABLET BY MOUTH EVERY 6 HOURS AS NEEDED FOR MUSCLE SPASM, Disp: , Rfl:    ZEPBOUND  15 MG/0.5ML Pen, Inject 15 mg into the skin once a week., Disp: 3 mL, Rfl: 3   terbinafine  (LAMISIL ) 250 MG tablet, Take 1 tablet (250 mg total) by mouth daily. (Patient not taking: Reported on 12/04/2023), Disp: 30 tablet, Rfl: 0  Allergies  Allergen Reactions   Penicillins Hives and Nausea And Vomiting    Has patient had a PCN reaction causing immediate rash, facial/tongue/throat swelling, SOB or lightheadedness with hypotension: No Has patient had a PCN reaction causing severe rash involving mucus membranes or skin necrosis: Yes Has patient had a PCN reaction that required hospitalization No Has patient had a PCN reaction occurring within the last 10 years: No If all of the above answers are NO, then may proceed with Cephalosporin use.    Latex Rash and Dermatitis    Objective:   BP 132/84   Pulse 78   Temp 98.5 F (36.9 C)   Ht 5' 7 (1.702 m)   Wt (!) 301 lb 12.8 oz (136.9 kg)   LMP 11/12/2023 (Exact Date)   SpO2 100%   BMI 47.27 kg/m      12/04/2023    9:11 AM 09/13/2023    8:19 AM 09/12/2023    8:18 PM  Vitals with BMI  Height 5' 7 5' 7 5' 7  Weight 301 lbs 13 oz 304 lbs 308 lbs  BMI 47.26 47.6 48.23  Systolic 132 116   Diastolic 84 80   Pulse 78 76      Physical Exam Vitals and nursing note reviewed.  Constitutional:      Appearance: Normal appearance. She is obese.  HENT:     Head:  Normocephalic and atraumatic.  Cardiovascular:     Rate and Rhythm: Normal rate and regular rhythm.     Pulses: Normal pulses.     Heart sounds: Normal heart sounds.  Pulmonary:     Effort: Pulmonary effort is normal.     Breath sounds: Normal breath sounds.  Skin:    General: Skin is warm and dry.  Neurological:     General: No focal deficit present.     Mental Status: She is alert and oriented to person, place, and time. Mental status is at baseline.  Psychiatric:        Mood and Affect: Mood normal.        Behavior: Behavior normal.  Thought Content: Thought content normal.        Judgment: Judgment normal.     Assessment & Plan:  Morbid obesity with BMI of 50.0-59.9, adult [facility]) Assessment & Plan: Continue Zepbound  15mg  weekly. Cost is barrier for continuing. Will work with PA team to try to get this covered given her multitude of medical conditions including morbid obesity, OSA, prediabetes, HTN, HLD. Zepbound  has been approved for use for OSA.   OSA (obstructive sleep apnea) Assessment & Plan: On CPAP. See sleep studies 09/13/2023. Would benefit greatly from continuing Zepbound , needs insurance coverage for OSA. Zepbound  has been approved and indicated for use for treatment of OSA.   Essential hypertension Assessment & Plan: Well controlled on Olmesartan  40mg  daily and Chlorthalidone  12.5mg  daily. Recommend heart healthy diet such as Mediterranean diet with whole grains, fruits, vegetable, fish, lean meats, nuts, and olive oil. Limit salt. Encouraged moderate walking, 3-5 times/week for 30-50 minutes each session. Aim for at least 150 minutes.week. Goal should be pace of 3 miles/hours, or walking 1.5 miles in 30 minutes. Avoid tobacco products. Avoid excess alcohol. Take medications as prescribed and bring medications and blood pressure log with cuff to each office visit. Seek medical care for chest pain, palpitations, shortness of breath with exertion,  dizziness/lightheadedness, vision changes, recurrent headaches, or swelling of extremities. Follow up for CPE in 4 months      Follow up plan: Return in about 4 months (around 04/18/2024) for annual physical with labs 1 week prior.  Jeoffrey GORMAN Barrio, FNP

## 2023-12-04 NOTE — Assessment & Plan Note (Addendum)
 On CPAP. See sleep studies 09/13/2023. Would benefit greatly from continuing Zepbound , needs insurance coverage for OSA. Zepbound  has been approved and indicated for use for treatment of OSA.

## 2023-12-06 ENCOUNTER — Other Ambulatory Visit: Payer: Self-pay | Admitting: Family Medicine

## 2023-12-06 DIAGNOSIS — I10 Essential (primary) hypertension: Secondary | ICD-10-CM

## 2023-12-11 ENCOUNTER — Other Ambulatory Visit: Payer: Self-pay

## 2023-12-11 DIAGNOSIS — G4733 Obstructive sleep apnea (adult) (pediatric): Secondary | ICD-10-CM

## 2023-12-11 MED ORDER — ZEPBOUND 15 MG/0.5ML ~~LOC~~ SOLN: 15.0000 mg | SUBCUTANEOUS | 1 refills | Status: DC

## 2024-01-20 ENCOUNTER — Encounter: Payer: Self-pay | Admitting: Family Medicine

## 2024-01-29 ENCOUNTER — Other Ambulatory Visit: Payer: Self-pay

## 2024-01-30 ENCOUNTER — Other Ambulatory Visit: Payer: Self-pay | Admitting: Family Medicine

## 2024-01-30 MED ORDER — METFORMIN HCL ER (OSM) 500 MG PO TB24: 500.0000 mg | ORAL_TABLET | Freq: Every day | ORAL | 1 refills | Status: DC

## 2024-02-15 ENCOUNTER — Other Ambulatory Visit: Payer: Self-pay | Admitting: Obstetrics and Gynecology

## 2024-02-20 ENCOUNTER — Encounter (HOSPITAL_COMMUNITY): Payer: Self-pay | Admitting: Obstetrics and Gynecology

## 2024-02-20 NOTE — Progress Notes (Signed)
 Spoke w/ via phone for pre-op interview--- Carla [name] needs dos----  CBC, UPT and T&S per surgeon, needs second sign. BMP and EKG per anesthesia.     [name] results------ COVID test -----patient states asymptomatic no test needed Arrive at -------1400 NPO after MN NO Solid Food.  Clear liquids from MN until---1300 Pre-Surgery Ensure or G2:  Med rec completed Medications to take morning of surgery -----NONE Diabetic medication ----- NONE AM of surgery.  GLP1 agonist last dose: Zepbound  15mg  weekly. Last dose was 02/17/24 GLP1 instructions: Hold any further doses of Zepbound  until after surgery, pt verbalized understanding.  Patient instructed no nail polish to be worn day of surgery Patient instructed to bring photo id and insurance card day of surgery Patient aware to have Driver (ride ) / caregiver    for 24 hours after surgery - Husband Carla [name] Patient Special Instructions ----- Pre-Op special Instructions -----  Patient verbalized understanding of instructions that were given at this phone interview. Patient denies chest pain, sob, fever, cough at the interview.

## 2024-02-27 ENCOUNTER — Ambulatory Visit (HOSPITAL_COMMUNITY)
Admission: RE | Admit: 2024-02-27 | Discharge: 2024-02-27 | Disposition: A | Attending: Obstetrics and Gynecology | Admitting: Obstetrics and Gynecology

## 2024-02-27 ENCOUNTER — Ambulatory Visit (HOSPITAL_COMMUNITY): Admitting: Anesthesiology

## 2024-02-27 ENCOUNTER — Other Ambulatory Visit: Payer: Self-pay

## 2024-02-27 ENCOUNTER — Encounter (HOSPITAL_COMMUNITY)
Admission: RE | Disposition: A | Discharge status: Home / Self Care | Payer: Self-pay | Source: Home / Self Care | Attending: Obstetrics and Gynecology

## 2024-02-27 ENCOUNTER — Encounter (HOSPITAL_COMMUNITY): Payer: Self-pay | Admitting: Obstetrics and Gynecology

## 2024-02-27 DIAGNOSIS — I10 Essential (primary) hypertension: Secondary | ICD-10-CM

## 2024-02-27 HISTORY — PX: LEEP: SHX91

## 2024-02-27 HISTORY — PX: COLPOSCOPY: SHX161

## 2024-02-27 LAB — CBC
HCT: 40.5 % (ref 36.0–46.0)
Hemoglobin: 13.3 g/dL (ref 12.0–15.0)
MCH: 32.2 pg (ref 26.0–34.0)
MCHC: 32.8 g/dL (ref 30.0–36.0)
MCV: 98.1 fL (ref 80.0–100.0)
Platelets: 312 K/uL (ref 150–400)
RBC: 4.13 MIL/uL (ref 3.87–5.11)
RDW: 13.2 % (ref 11.5–15.5)
WBC: 4.6 K/uL (ref 4.0–10.5)
nRBC: 0 % (ref 0.0–0.2)

## 2024-02-27 LAB — BASIC METABOLIC PANEL WITH GFR
Anion gap: 4 — ABNORMAL LOW (ref 5–15)
BUN: 14 mg/dL (ref 6–20)
CO2: 30 mmol/L (ref 22–32)
Calcium: 8.9 mg/dL (ref 8.9–10.3)
Chloride: 106 mmol/L (ref 98–111)
Creatinine, Ser: 0.81 mg/dL (ref 0.44–1.00)
GFR, Estimated: >60 mL/min (ref >60)
Glucose, Bld: 74 mg/dL (ref 70–99)
Potassium: 3.3 mmol/L — ABNORMAL LOW (ref 3.5–5.1)
Sodium: 140 mmol/L (ref 135–145)

## 2024-02-27 LAB — TYPE AND SCREEN
ABO/RH(D): AB POS
Antibody Screen: NEGATIVE

## 2024-02-27 LAB — POCT PREGNANCY, URINE: Preg Test, Ur: NEGATIVE

## 2024-02-27 SURGERY — LEEP (LOOP ELECTROSURGICAL EXCISION PROCEDURE)
Anesthesia: General | Site: Cervix

## 2024-02-27 MED ORDER — LIDOCAINE HCL 1 % IJ SOLN
INTRAMUSCULAR | Status: DC | PRN
  Administered 2024-02-27: 10 mL

## 2024-02-27 MED ORDER — ONDANSETRON HCL 4 MG/2ML IJ SOLN
INTRAMUSCULAR | Status: DC | PRN
  Administered 2024-02-27: 4 mg via INTRAVENOUS

## 2024-02-27 MED ORDER — DIPHENHYDRAMINE HCL 50 MG/ML IJ SOLN
INTRAMUSCULAR | Status: DC | PRN
  Administered 2024-02-27: 12.5 mg via INTRAVENOUS

## 2024-02-27 MED ORDER — SODIUM CHLORIDE (PF) 0.9 % IJ SOLN
INTRAMUSCULAR | Status: AC
  Filled 2024-02-27: qty 50

## 2024-02-27 MED ORDER — MIDAZOLAM HCL (PF) 2 MG/2ML IJ SOLN
INTRAMUSCULAR | Status: DC | PRN
  Administered 2024-02-27: 2 mg via INTRAVENOUS

## 2024-02-27 MED ORDER — FENTANYL CITRATE (PF) 100 MCG/2ML IJ SOLN
INTRAMUSCULAR | Status: AC
  Filled 2024-02-27: qty 2

## 2024-02-27 MED ORDER — DEXMEDETOMIDINE HCL IN NACL 80 MCG/20ML IV SOLN
INTRAVENOUS | Status: DC | PRN
  Administered 2024-02-27: 8 ug via INTRAVENOUS

## 2024-02-27 MED ORDER — MONSELS FERRIC SUBSULFATE EX PSTE
PASTE | CUTANEOUS | Status: AC
  Filled 2024-02-27: qty 8

## 2024-02-27 MED ORDER — IBUPROFEN 600 MG PO TABS: 600.0000 mg | ORAL_TABLET | Freq: Four times a day (QID) | ORAL | 1 refills | Status: AC | PRN

## 2024-02-27 MED ORDER — POVIDONE-IODINE 10 % EX SWAB: 2.0000 | Freq: Once | CUTANEOUS | Status: DC

## 2024-02-27 MED ORDER — MONSELS FERRIC SUBSULFATE EX SOLN
CUTANEOUS | Status: DC | PRN
  Administered 2024-02-27: 1 via TOPICAL

## 2024-02-27 MED ORDER — LIDOCAINE 2% (20 MG/ML) 5 ML SYRINGE
INTRAMUSCULAR | Status: DC | PRN
  Administered 2024-02-27: 100 mg via INTRAVENOUS

## 2024-02-27 MED ORDER — 0.9 % SODIUM CHLORIDE (POUR BTL) OPTIME
TOPICAL | Status: DC | PRN
  Administered 2024-02-27: 1000 mL

## 2024-02-27 MED ORDER — ACETIC ACID 5 % SOLN
Status: DC | PRN
  Administered 2024-02-27: 1 via TOPICAL

## 2024-02-27 MED ORDER — FENTANYL CITRATE (PF) 100 MCG/2ML IJ SOLN
INTRAMUSCULAR | Status: DC | PRN
  Administered 2024-02-27 (×2): 50 ug via INTRAVENOUS

## 2024-02-27 MED ORDER — LIDOCAINE HCL (PF) 1 % IJ SOLN
INTRAMUSCULAR | Status: AC
  Filled 2024-02-27: qty 30

## 2024-02-27 MED ORDER — MIDAZOLAM HCL 2 MG/2ML IJ SOLN
INTRAMUSCULAR | Status: AC
  Filled 2024-02-27: qty 2

## 2024-02-27 MED ORDER — ACETIC ACID 5 % SOLN
Status: AC
  Filled 2024-02-27: qty 12

## 2024-02-27 MED ORDER — LIDOCAINE HCL 1 % IJ SOLN
INTRAMUSCULAR | Status: AC
  Filled 2024-02-27: qty 20

## 2024-02-27 MED ORDER — CHLORHEXIDINE GLUCONATE 0.12 % MT SOLN
15.0000 mL | Freq: Once | OROMUCOSAL | Status: AC
  Administered 2024-02-27: 15 mL via OROMUCOSAL
  Filled 2024-02-27: qty 15

## 2024-02-27 MED ORDER — VASOPRESSIN 20 UNIT/ML IV SOLN
INTRAVENOUS | Status: AC
  Filled 2024-02-27: qty 1

## 2024-02-27 MED ORDER — ORAL CARE MOUTH RINSE: 15.0000 mL | Freq: Once | OROMUCOSAL | Status: AC

## 2024-02-27 MED ORDER — IODINE STRONG (LUGOLS) 5 % PO SOLN
ORAL | Status: AC
  Filled 2024-02-27: qty 1

## 2024-02-27 MED ORDER — DEXAMETHASONE SOD PHOSPHATE PF 10 MG/ML IJ SOLN
INTRAMUSCULAR | Status: DC | PRN
  Administered 2024-02-27: 10 mg via INTRAVENOUS

## 2024-02-27 MED ORDER — LACTATED RINGERS IV SOLN: INTRAVENOUS | Status: DC

## 2024-02-27 MED ORDER — PROPOFOL 10 MG/ML IV BOLUS
INTRAVENOUS | Status: DC | PRN
  Administered 2024-02-27: 120 ug/kg/min via INTRAVENOUS

## 2024-02-27 SURGICAL SUPPLY — 18 items
CLEANER TIP ELECTROSURG 2X2 (MISCELLANEOUS) ×2 IMPLANT
ELECT BALL LEEP 5MM RED (ELECTRODE) IMPLANT
ELECTRODE LOOP LP RND 15X12GRN (CUTTING LOOP) IMPLANT
ELECTRODE REM PT RTRN 9FT ADLT (ELECTROSURGICAL) ×2 IMPLANT
GLOVE BIOGEL PI IND STRL 6 (GLOVE) IMPLANT
GLOVE BIOGEL PI IND STRL 7.0 (GLOVE) ×2 IMPLANT
GLOVE BIOGEL PI IND STRL 7.5 (GLOVE) ×2 IMPLANT
GLOVE SURG SS PI 7.5 STRL IVOR (GLOVE) IMPLANT
GOWN STRL REUS W/ TWL LRG LVL3 (GOWN DISPOSABLE) ×4 IMPLANT
GOWN STRL SURGICAL XL XLNG (GOWN DISPOSABLE) IMPLANT
KIT TURNOVER KIT B (KITS) ×2 IMPLANT
PACK VAGINAL MINOR WOMEN LF (CUSTOM PROCEDURE TRAY) ×2 IMPLANT
PAD OB MATERNITY 11 LF (PERSONAL CARE ITEMS) ×2 IMPLANT
PENCIL SMOKE EVACUATOR (MISCELLANEOUS) ×2 IMPLANT
SCOPETTES 8 STERILE (MISCELLANEOUS) ×4 IMPLANT
SOLN 0.9% NACL POUR BTL 1000ML (IV SOLUTION) ×2 IMPLANT
SUT VIC AB 0 CT1 27XBRD ANBCTR (SUTURE) ×2 IMPLANT
TOWEL GREEN STERILE FF (TOWEL DISPOSABLE) ×4 IMPLANT

## 2024-02-27 NOTE — Anesthesia Procedure Notes (Signed)
 Procedure Name: LMA Insertion Date/Time: 02/27/2024 5:03 PM  Performed by: Julien Manus, CRNAPre-anesthesia Checklist: Patient identified, Emergency Drugs available, Suction available and Patient being monitored Patient Re-evaluated:Patient Re-evaluated prior to induction Oxygen Delivery Method: Circle System Utilized Preoxygenation: Pre-oxygenation with 100% oxygen Induction Type: IV induction Ventilation: Mask ventilation without difficulty LMA: LMA inserted LMA Size: 4.0 Number of attempts: 1 Airway Equipment and Method: Bite block Placement Confirmation: positive ETCO2 Tube secured with: Tape Dental Injury: Teeth and Oropharynx as per pre-operative assessment

## 2024-02-27 NOTE — Transfer of Care (Signed)
 Immediate Anesthesia Transfer of Care Note  Patient: Carla [name]  Procedure(s) Performed: LEEP (LOOP ELECTROSURGICAL EXCISION PROCEDURE) (Cervix) COLPOSCOPY (Cervix)  Patient Location: PACU  Anesthesia Type:General  Level of Consciousness: awake and alert   Airway & Oxygen Therapy: Patient Spontanous Breathing and Patient connected to face mask oxygen  Post-op Assessment: Report given to RN and Post -op Vital signs reviewed and stable  Post vital signs: Reviewed and stable  Last Vitals:  Vitals Value Taken Time  BP 123/77 02/27/24 17:47  Temp    Pulse 67 02/27/24 17:52  Resp 10 02/27/24 17:52  SpO2 100 % 02/27/24 17:52  Vitals shown include unfiled device data.  Last Pain:  Vitals:   02/27/24 1332  TempSrc: Oral  PainSc: 0-No pain      Patients Stated Pain Goal: 4 (02/27/24 1332)  Complications: No notable events documented.

## 2024-02-27 NOTE — H&P (Signed)
 Carla [name] is an 43 y.o. female. Pt known to me presenting for LEEP today.  Pertinent Gynecological History: Menses: h/o regular cycles  Previous GYN Procedures: Colpo and Bxs CIN 3 on ECC 09/2023   Last mammogram: birad 2 01-25-24  Last pap: abnormal: ASC-H w/+HRHPV Date: 09-18-23 OB History: G5, P0232   Menstrual History:  Patient's last menstrual period was 02/18/2024 (approximate).    Past Medical History:  Diagnosis Date   Allergy    Asthma    Fibroids    Moderate obstructive sleep apnea    Obesity    Pregnancy induced hypertension     Past Surgical History:  Procedure Laterality Date   CERVICAL CERCLAGE  2003, 2008   CERVICAL CERCLAGE N/A 04/12/2016   Procedure: CERCLAGE CERVICAL;  Surgeon: Rosaline Luna, MD;  Location: [facility];  Service: Gynecology;  Laterality: N/A;   CERVICAL CERCLAGE N/A 09/23/2016   Procedure: removal CERCLAGE CERVICAL;  Surgeon: Bettina Muskrat, MD;  Location: [facility] BIRTHING SUITES;  Service: Obstetrics;  Laterality: N/A;   CESAREAN SECTION N/A 09/23/2016   Procedure: CESAREAN SECTION;  Surgeon: Bettina Muskrat, MD;  Location: [facility] BIRTHING SUITES;  Service: Obstetrics;  Laterality: N/A;  RNFA  Tracey T   DILATION AND CURETTAGE OF UTERUS     ectopic     MYOMECTOMY  2016   fibroid removal    Family History  Problem Relation Age of Onset   Hypertension Mother    Alzheimer's disease Maternal Grandmother    Cancer Maternal Grandfather    Kidney disease Paternal Grandmother    Diabetes Paternal Grandmother    Cancer Paternal Grandfather    Asthma Brother    Heart disease Maternal Aunt    Asthma Maternal Aunt    Learning disabilities Paternal Uncle    Cancer Paternal Uncle    Heart disease Paternal Uncle    Diabetes Paternal Uncle    Diabetes Maternal Uncle    Cancer Maternal Uncle    Diabetes Paternal Aunt     Social History:  reports that she has never smoked. She has never used smokeless tobacco. She reports current alcohol use of about 1.0 -  2.0 standard drink of alcohol per week. She reports that she does not use drugs.  Allergies:  Allergies  Allergen Reactions   Penicillins Hives and Nausea And Vomiting    Has patient had a PCN reaction causing immediate rash, facial/tongue/throat swelling, SOB or lightheadedness with hypotension: No Has patient had a PCN reaction causing severe rash involving mucus membranes or skin necrosis: Yes Has patient had a PCN reaction that required hospitalization No Has patient had a PCN reaction occurring within the last 10 years: No If all of the above answers are NO, then may proceed with Cephalosporin use.    Latex Rash and Dermatitis    Medications Prior to Admission  Medication Sig Dispense Refill Last Dose/Taking   chlorthalidone  (HYGROTON ) 25 MG tablet TAKE 1/2 TABLET BY MOUTH DAILY 45 tablet 1 02/26/2024   cholecalciferol (VITAMIN D3) 25 MCG (1000 UNIT) tablet Take 1 tablet (1,000 Units total) by mouth daily. 90 tablet 0 02/26/2024   metFORMIN  (FORTAMET ) 500 MG (OSM) 24 hr tablet Take 1 tablet (500 mg total) by mouth daily with breakfast. (Patient taking differently: Take 500 mg by mouth daily with breakfast. Weight loss) 90 tablet 1 02/26/2024   Multiple Vitamin (MULTIVITAMIN WITH MINERALS) TABS tablet Take 1 tablet by mouth daily.   02/26/2024   olmesartan  (BENICAR ) 40 MG tablet TAKE 1 TABLET BY MOUTH  EVERY DAY 30 tablet 9 02/26/2024 Evening   albuterol  (VENTOLIN  HFA) 108 (90 Base) MCG/ACT inhaler Inhale 2 puffs into the lungs every 4 (four) hours as needed for wheezing or shortness of breath (before exercise). 18 g 2 More than a month   albuterol  (VENTOLIN  HFA) 108 (90 Base) MCG/ACT inhaler Inhale 1-2 puffs into the lungs every 4 (four) hours as needed for wheezing or shortness of breath.   Unknown   cetirizine  (ZYRTEC ) 10 MG tablet Take 1 tablet (10 mg total) by mouth daily as needed for allergies. 30 tablet 11 More than a month   cyclobenzaprine  (FLEXERIL ) 10 MG tablet Take 1 tablet (10  mg total) by mouth 3 (three) times daily as needed for muscle spasms. 30 tablet 0 More than a month   EPINEPHrine  0.3 mg/0.3 mL IJ SOAJ injection Inject 0.3 mg into the muscle as needed for anaphylaxis. 1 each 0    fluticasone  (FLONASE ) 50 MCG/ACT nasal spray Place 1 spray into both nostrils daily. 16 g 6 More than a month   ondansetron  (ZOFRAN ) 4 MG tablet Take 4 mg by mouth every 8 (eight) hours as needed for nausea or vomiting.   Unknown   terbinafine  (LAMISIL ) 250 MG tablet Take 1 tablet (250 mg total) by mouth daily. (Patient not taking: Reported on 12/04/2023) 30 tablet 0    Tirzepatide -Weight Management (ZEPBOUND ) 15 MG/0.5ML SOLN Inject 15 mg into the skin once a week. 2 mL 1 02/17/2024   tiZANidine  (ZANAFLEX ) 4 MG tablet tizanidine  4 mg tablet  TAKE 1 TABLET BY MOUTH EVERY 6 HOURS AS NEEDED FOR MUSCLE SPASM   More than a month    Review of Systems  Blood pressure 133/87, pulse 80, temperature 98.5 F (36.9 C), temperature source Oral, resp. rate 18, height 5' 7 (1.702 m), weight (!) 137.4 kg, last menstrual period 02/18/2024, SpO2 99%. Physical Exam  Results for orders placed or performed during the hospital encounter of 02/27/24 (from the past 24 hours)  Pregnancy, urine POC     Status: None   Collection Time: 02/27/24  1:09 PM  Result Value Ref Range   Preg Test, Ur NEGATIVE NEGATIVE  CBC     Status: None   Collection Time: 02/27/24  1:56 PM  Result Value Ref Range   WBC 4.6 4.0 - 10.5 K/uL   RBC 4.13 3.87 - 5.11 MIL/uL   Hemoglobin 13.3 12.0 - 15.0 g/dL   HCT 59.4 63.9 - 53.9 %   MCV 98.1 80.0 - 100.0 fL   MCH 32.2 26.0 - 34.0 pg   MCHC 32.8 30.0 - 36.0 g/dL   RDW 86.7 88.4 - 84.4 %   Platelets 312 150 - 400 K/uL   nRBC 0.0 0.0 - 0.2 %  Type and screen     Status: None   Collection Time: 02/27/24  1:56 PM  Result Value Ref Range   ABO/RH(D) AB POS    Antibody Screen NEG    Sample Expiration      03/01/2024,2359 Performed at [facility], [address],  [place], [place] [address]   Basic metabolic panel     Status: Abnormal   Collection Time: 02/27/24  2:42 PM  Result Value Ref Range   Sodium 140 135 - 145 mmol/L   Potassium 3.3 (L) 3.5 - 5.1 mmol/L   Chloride 106 98 - 111 mmol/L   CO2 30 22 - 32 mmol/L   Glucose, Bld 74 70 - 99 mg/dL   BUN 14  6 - 20 mg/dL   Creatinine, Ser 9.18 0.44 - 1.00 mg/dL   Calcium 8.9 8.9 - 89.6 mg/dL   GFR, Estimated >39 >39 mL/min   Anion gap 4 (L) 5 - 15    No results found.  Assessment/Plan: 43yo with CIN 3 presenting today for LEEP.  Risks benefits and alternatives reviewed including but not limited to bleeding infection and injury.  Questions answered and consent signed and witnessed.  Jon CINDERELLA Rummer 02/27/2024, 4:14 PM

## 2024-02-27 NOTE — Discharge Summary (Signed)
 Physician Discharge Summary  Patient ID: Carla [name] MRN: [id number] DOB/AGE: [birth year] 43 y.o.  Admit date: 02/27/2024 Discharge date: 02/27/2024  Admission Diagnoses: CIN 3 Discharge Diagnoses:  Active Problems:   * No active hospital problems. * CIN 3  Discharged Condition: good  Hospital Course: LEEP performed today uneventfully  Consults: None  Significant Diagnostic Studies: LEEP specimen to path  Treatments: LEEP  Discharge Exam: Blood pressure 138/85, pulse 79, temperature 97.8 F (36.6 C), resp. rate 18, height 5' 7 (1.702 m), weight (!) 137.4 kg, last menstrual period 02/18/2024, SpO2 95%. General appearance: alert, cooperative, and no distress Resp: clear to auscultation bilaterally Cardio: regular rate and rhythm Extremities: no calf tenderness  Disposition: Discharge disposition: 01-Home or Self Care        Allergies as of 02/27/2024       Reactions   Penicillins Hives, Nausea And Vomiting   Has patient had a PCN reaction causing immediate rash, facial/tongue/throat swelling, SOB or lightheadedness with hypotension: No Has patient had a PCN reaction causing severe rash involving mucus membranes or skin necrosis: Yes Has patient had a PCN reaction that required hospitalization No Has patient had a PCN reaction occurring within the last 10 years: No If all of the above answers are NO, then may proceed with Cephalosporin use.   Latex Rash, Dermatitis        Medication List     TAKE these medications    albuterol  108 (90 Base) MCG/ACT inhaler Commonly known as: VENTOLIN  HFA Inhale 1-2 puffs into the lungs every 4 (four) hours as needed for wheezing or shortness of breath.   albuterol  108 (90 Base) MCG/ACT inhaler Commonly known as: VENTOLIN  HFA Inhale 2 puffs into the lungs every 4 (four) hours as needed for wheezing or shortness of breath (before exercise).   cetirizine  10 MG tablet Commonly known as: ZYRTEC  Take 1 tablet (10 mg total)  by mouth daily as needed for allergies.   chlorthalidone  25 MG tablet Commonly known as: HYGROTON  TAKE 1/2 TABLET BY MOUTH DAILY   cholecalciferol 25 MCG (1000 UNIT) tablet Commonly known as: VITAMIN D3 Take 1 tablet (1,000 Units total) by mouth daily.   cyclobenzaprine  10 MG tablet Commonly known as: FLEXERIL  Take 1 tablet (10 mg total) by mouth 3 (three) times daily as needed for muscle spasms.   EPINEPHrine  0.3 mg/0.3 mL Soaj injection Commonly known as: EPI-PEN Inject 0.3 mg into the muscle as needed for anaphylaxis.   fluticasone  50 MCG/ACT nasal spray Commonly known as: FLONASE  Place 1 spray into both nostrils daily.   ibuprofen 600 MG tablet Commonly known as: ADVIL Take 1 tablet (600 mg total) by mouth every 6 (six) hours as needed for mild pain (pain score 1-3), moderate pain (pain score 4-6) or cramping.   metformin  500 MG (OSM) 24 hr tablet Commonly known as: FORTAMET  Take 1 tablet (500 mg total) by mouth daily with breakfast. What changed: additional instructions   multivitamin with minerals Tabs tablet Take 1 tablet by mouth daily.   olmesartan  40 MG tablet Commonly known as: BENICAR  TAKE 1 TABLET BY MOUTH EVERY DAY   ondansetron  4 MG tablet Commonly known as: ZOFRAN  Take 4 mg by mouth every 8 (eight) hours as needed for nausea or vomiting.   terbinafine  250 MG tablet Commonly known as: LAMISIL  Take 1 tablet (250 mg total) by mouth daily.   tiZANidine  4 MG tablet Commonly known as: ZANAFLEX  tizanidine  4 mg tablet  TAKE 1 TABLET BY MOUTH EVERY 6 HOURS  AS NEEDED FOR MUSCLE SPASM   Zepbound  15 MG/0.5ML Soln Generic drug: Tirzepatide -Weight Management Inject 15 mg into the skin once a week.        Follow-up Information     Henry Slough, MD. Go on 03/05/2024.   Specialty: Obstetrics and Gynecology Why: for a post op appointment at 3:30pm. Contact information: [address] [place] [address] [phone]         Henry Slough, MD. Schedule an appointment as soon as possible for a visit in 6 week(s).   Specialty: Obstetrics and Gynecology Why: for a post op appointment Contact information: [address] [place] [address] [phone]                 Signed: Slough CINDERELLA Henry 02/27/2024, 6:05 PM

## 2024-02-27 NOTE — Anesthesia Preprocedure Evaluation (Addendum)
 Anesthesia Evaluation  Patient identified by MRN, date of birth, ID band Patient awake    Reviewed: Allergy & Precautions, NPO status , Patient's Chart, lab work & pertinent test results, Unable to perform ROS - Chart review only  History of Anesthesia Complications Negative for: history of anesthetic complications  Airway Mallampati: II  TM Distance: >3 FB Neck ROM: Full    Dental no notable dental hx.    Pulmonary asthma , sleep apnea    Pulmonary exam normal        Cardiovascular hypertension, Pt. on medications Normal cardiovascular exam     Neuro/Psych negative neurological ROS     GI/Hepatic negative GI ROS, Neg liver ROS,,,  Endo/Other    Class 3 obesity  Renal/GU negative Renal ROS     Musculoskeletal negative musculoskeletal ROS (+)    Abdominal   Peds  Hematology negative hematology ROS (+)   Anesthesia Other Findings Day of surgery medications reviewed with the patient.  Reproductive/Obstetrics                              Anesthesia Physical Anesthesia Plan  ASA: 3  Anesthesia Plan: General   Post-op Pain Management:    Induction: Intravenous  PONV Risk Score and Plan: 3 and Ondansetron , Dexamethasone, Treatment may vary due to age or medical condition and Midazolam  Airway Management Planned: LMA  Additional Equipment: None  Intra-op Plan:   Post-operative Plan: Extubation in OR  Informed Consent: I have reviewed the patients History and Physical, chart, labs and discussed the procedure including the risks, benefits and alternatives for the proposed anesthesia with the patient or authorized representative who has indicated his/her understanding and acceptance.     Dental advisory given  Plan Discussed with: Anesthesiologist, CRNA and Surgeon  Anesthesia Plan Comments:          Anesthesia Quick Evaluation

## 2024-02-27 NOTE — Op Note (Signed)
 Preop Diagnosis: Carcinoma in situ of cervix/CIN 3  Postop Diagnosis: Carcinoma in situ of cervix/CIN 3  Procedure: 1.Colposcopy 2.LEEP 3.Removal of Stitch in posterior cervix  PR CONIZATION CERVIX W/WO D&C RPR ELTRD EXC [[id number]]   Anesthesia: See Flowsheet  Anesthesiologist: Paul Lamarr BRAVO, MD   Attending: Henry Slough, MD   Assistant: N/a  Findings: Cervical leep specimen, acetowhite around TZ.  Stitch in posterior lip of cervix (?cerclage d/t history but once removed did appear the usual cerclage suture type vs stitch placed at time of colpo performed by Dr. Gloriann for bleeding control.  Will discuss further at post op appt and see colpo note).  Questionable herpetic appearing lesions on left labia.  Pathology: Cervical leep specimen (peach pin at 3 o'clock and purple pin at 9 o'clock with two small pieces pinned separate from the larger 3 o'clock pinned portion of cervical tissue).  Fluids: 700 cc  UOP: Voided prior to procedure  EBL: Minimal <5cc  Complications: None  Procedure:  The patient was taken to the operating room after the risks, benefits and alternatives were discussed with the patient. The patient verbalized understanding and consent signed and witnessed. The patient was placed under general anesthesia per anesthesiologist and prepped and draped in the normal sterile fashion.  A TIME OUT was performed per protocol.  A weighted speculum was placed in the patient's vagina and the anterior lip of the cervix was grasped with a single tooth tenaculum.  A stitch was noted posterior on the cervix.  A LEEP was performed without difficulty and the bed of the cervix was cauterized with the ball tip cautery.  The stitch had to be removed as it was in the way and incidentally cut at the time of the LEEP with the cautery device.  The bed was made hemostatic with the ball tip cautery and monsel's placed.  All instruments were removed. Sponge lap and needle counts were  correct. Patient tolerated the procedure well and was awaiting transfer to the recovery room in good condition.

## 2024-02-28 ENCOUNTER — Encounter (HOSPITAL_COMMUNITY): Payer: Self-pay | Admitting: Obstetrics and Gynecology

## 2024-02-28 NOTE — Anesthesia Postprocedure Evaluation (Signed)
 Anesthesia Post Note  Patient: Carla [name]  Procedure(s) Performed: LEEP (LOOP ELECTROSURGICAL EXCISION PROCEDURE) (Cervix) COLPOSCOPY (Cervix)     Patient location during evaluation: PACU Anesthesia Type: General Level of consciousness: awake and alert Pain management: pain level controlled Vital Signs Assessment: post-procedure vital signs reviewed and stable Respiratory status: spontaneous breathing, nonlabored ventilation and respiratory function stable Cardiovascular status: blood pressure returned to baseline Postop Assessment: no apparent nausea or vomiting Anesthetic complications: no   No notable events documented.  Last Vitals:  Vitals:   02/27/24 1815 02/27/24 1830  BP: (!) 141/88 137/87  Pulse: (!) 59 60  Resp: 20 15  Temp:    SpO2: 100% 97%    Last Pain:  Vitals:   02/27/24 1830  TempSrc:   PainSc: Asleep                 Vertell Row

## 2024-02-29 LAB — SURGICAL PATHOLOGY

## 2024-03-20 ENCOUNTER — Other Ambulatory Visit: Payer: Self-pay | Admitting: Family Medicine

## 2024-03-20 DIAGNOSIS — I10 Essential (primary) hypertension: Secondary | ICD-10-CM

## 2024-04-15 ENCOUNTER — Encounter: Payer: Self-pay | Admitting: Family Medicine

## 2024-04-15 ENCOUNTER — Ambulatory Visit: Admitting: Family Medicine

## 2024-04-15 VITALS — BP 115/73 | HR 106 | Temp 97.9°F | Ht 67.0 in | Wt 305.2 lb

## 2024-04-15 DIAGNOSIS — J4520 Mild intermittent asthma, uncomplicated: Secondary | ICD-10-CM

## 2024-04-15 DIAGNOSIS — E559 Vitamin D deficiency, unspecified: Secondary | ICD-10-CM | POA: Diagnosis not present

## 2024-04-15 DIAGNOSIS — G4733 Obstructive sleep apnea (adult) (pediatric): Secondary | ICD-10-CM

## 2024-04-15 DIAGNOSIS — Z6841 Body Mass Index (BMI) 40.0 and over, adult: Secondary | ICD-10-CM | POA: Diagnosis not present

## 2024-04-15 DIAGNOSIS — Z0000 Encounter for general adult medical examination without abnormal findings: Secondary | ICD-10-CM

## 2024-04-15 DIAGNOSIS — Z0001 Encounter for general adult medical examination with abnormal findings: Secondary | ICD-10-CM

## 2024-04-15 DIAGNOSIS — I10 Essential (primary) hypertension: Secondary | ICD-10-CM

## 2024-04-15 DIAGNOSIS — Z23 Encounter for immunization: Secondary | ICD-10-CM | POA: Diagnosis not present

## 2024-04-15 DIAGNOSIS — E782 Mixed hyperlipidemia: Secondary | ICD-10-CM | POA: Diagnosis not present

## 2024-04-15 MED ORDER — METFORMIN HCL ER 500 MG PO TB24: 1000.0000 mg | ORAL_TABLET | Freq: Every day | ORAL | Status: AC

## 2024-04-15 MED ORDER — METFORMIN HCL ER (OSM) 500 MG PO TB24: 1000.0000 mg | ORAL_TABLET | Freq: Every day | ORAL | Status: DC

## 2024-04-15 NOTE — Progress Notes (Signed)
 "  Complete physical exam  Patient: Carla [name]    DOB: [birth year] 43 y.o.   MRN: [id number]  Chief Complaint  Patient presents with   Annual Exam    Subjective:    [name] is a 44 y.o. female who presents today for a complete physical exam. She reports consuming a general diet. The patient does not participate in regular exercise at present. She generally feels well. She reports sleeping well. She does not have additional problems to discuss today.   PMH includes asthma, allergies, HTN, obesity, HLD, vitamin D  deficiency, OSA, prediabetes Followed by HTN clinic, OBGYN.  Ms Asby had a recent LEEP procedure.  Asthma and Allergies: on Cetirizine , PRN albuterol , PRN EpiPen , flonase   OSA: tolerating CPAP, on Zepbound  for added benefits, needs insurance coverage for affordability   Prediabetes: on metformin  1000mg  daily, Zepbound  15mg  weekly Lab Results  Component Value Date   HGBA1C 5.9 (H) 12/01/2022   HGBA1C 5.4 07/30/2019   HGBA1C 5.5 12/25/2018   HTN: on Chlorthalidone  12.5mg  daily, Olmesartan  40mg  daily  HLD: Lipid Panel     Component Value Date/Time   CHOL 192 08/28/2023 1017   TRIG 53 08/28/2023 1017   HDL 77 08/28/2023 1017   CHOLHDL 2.5 08/28/2023 1017   LDLCALC 101 (H) 08/28/2023 1017   Vit D Deficiency: 1000 units daily  Discussed the use of AI scribe software for clinical note transcription with the patient, who gave verbal consent to proceed.  History of Present Illness Carla [name] is a 44 year old female who presents for physical exam.  She underwent a LEEP procedure on March 29, 2024, for stage zero cervical intraepithelial neoplasia (CIN3). The procedure did not achieve clear margins, leaving CIN2 cells remaining. A second LEEP procedure is planned but not yet scheduled.  She has a history of elevated blood glucose levels and is currently on metformin , which was increased from 500 mg to 1000 mg. Despite this, her weight remains above 300  pounds, and she has been unable to lose weight despite increased physical activity and dietary changes. She is also on tirzepatide  15 mg weekly and has noted weight gain when off the medication post-surgery, which she attributes to water retention. She is paying out of pocket for this medication.  Her asthma is well-controlled with albuterol  as needed, which she uses about once a month. She also takes cetirizine  for seasonal allergies, chlorthalidone  12.5 mg, olmesartan  40 mg, and vitamin D  1000 units daily. She uses an EpiPen  as needed and takes cyclobenzaprine  occasionally for muscle spasms.  She uses a CPAP machine for sleep apnea, which she finds beneficial, noting a significant difference in her symptoms when not using it. She monitors her blood pressure at home, which remains within normal limits.  Her mammogram was last done on January 26, 2024. She had a Pap smear on April 09, 2024, as part of her follow-up for cervical dysplasia. She has completed two doses of the hepatitis B vaccine series and is unsure about the need for a third dose. No changes in hearing or vision, no dental issues, no thyroid problems, no gastrointestinal symptoms, and no concerning skin lesions.   Most recent fall risk assessment:    04/15/2024    8:20 AM  Fall Risk   Falls in the past year? 0  Number falls in past yr: 0  Injury with Fall? 0  Risk for fall due to : No Fall Risks  Follow up Falls evaluation completed     Most  recent depression screenings:    04/15/2024    8:20 AM 12/04/2023    9:23 AM  PHQ 2/9 Scores  PHQ - 2 Score 0 0  PHQ- 9 Score 0     Vision:Not within last year  and Dental: No current dental problems and Receives regular dental care Patient Active Problem List   Diagnosis Date Noted   Vitamin D  deficiency 04/15/2024   OSA (obstructive sleep apnea) 09/12/2023   Morbid obesity (HCC) 05/29/2023   Physical exam, annual 04/11/2023   Morbid obesity with BMI of 50.0-59.9, adult (HCC)  12/06/2022   Essential hypertension 11/15/2021   Temporomandibular jaw dysfunction 06/11/2020   Hyperlipidemia 12/25/2018   Pregnancy w/ hx of uterine myomectomy 08/26/2016   Environmental allergies 06/02/2015   Asthma    Past Medical History:  Diagnosis Date   Allergy    Asthma    Fibroids    Moderate obstructive sleep apnea    Obesity    Pregnancy induced hypertension    Sleep apnea    Past Surgical History:  Procedure Laterality Date   CERVICAL CERCLAGE  2003, 2008   CERVICAL CERCLAGE N/A 04/12/2016   Procedure: CERCLAGE CERVICAL;  Surgeon: Rosaline Luna, MD;  Location: [facility] ORS;  Service: Gynecology;  Laterality: N/A;   CERVICAL CERCLAGE N/A 09/23/2016   Procedure: removal CERCLAGE CERVICAL;  Surgeon: Bettina Muskrat, MD;  Location: [facility] BIRTHING SUITES;  Service: Obstetrics;  Laterality: N/A;   CESAREAN SECTION N/A 09/23/2016   Procedure: CESAREAN SECTION;  Surgeon: Bettina Muskrat, MD;  Location: [facility] BIRTHING SUITES;  Service: Obstetrics;  Laterality: N/A;  RNFA  Dwayne T   COLPOSCOPY  02/27/2024   Procedure: COLPOSCOPY;  Surgeon: Henry Slough, MD;  Location: [facility] OR;  Service: Gynecology;;   DILATION AND CURETTAGE OF UTERUS     ectopic     LEEP N/A 02/27/2024   Procedure: LEEP (LOOP ELECTROSURGICAL EXCISION PROCEDURE);  Surgeon: Henry Slough, MD;  Location: [facility] OR;  Service: Gynecology;  Laterality: N/A;   MYOMECTOMY  2016   fibroid removal   Social History[1] Family History  Problem Relation Age of Onset   Hypertension Mother    Diabetes Mother    Alzheimer's disease Maternal Grandmother    Cancer Maternal Grandfather    Kidney disease Paternal Grandmother    Diabetes Paternal Grandmother    Cancer Paternal Grandfather    Asthma Brother    Heart disease Maternal Aunt    Asthma Maternal Aunt    Learning disabilities Paternal Uncle    Cancer Paternal Uncle    Heart disease Paternal Uncle    Diabetes Paternal Uncle    Diabetes Maternal Uncle    Cancer Maternal Uncle     Diabetes Paternal Aunt    Allergies[2]    Patient Care Team: Kayla Jeoffrey RAMAN, FNP as PCP - General (Family Medicine) Vannie Reche RAMAN, NP as Nurse Practitioner (Cardiology)   Review of Systems  Constitutional: Negative.   HENT: Negative.    Eyes: Negative.   Respiratory: Negative.    Cardiovascular: Negative.   Gastrointestinal: Negative.   Genitourinary: Negative.   Musculoskeletal: Negative.   Skin: Negative.   Neurological: Negative.   Endo/Heme/Allergies: Negative.   Psychiatric/Behavioral: Negative.    All other systems reviewed and are negative.     Objective:    BP 115/73   Pulse (!) 106   Temp 97.9 F (36.6 C)   Ht 5' 7 (1.702 m)   Wt (!) 305 lb 3.2 oz (138.4 kg)   LMP 04/15/2024 (  Exact Date)   SpO2 100%   BMI 47.80 kg/m  BP Readings from Last 3 Encounters:  04/15/24 115/73  02/27/24 137/87  12/04/23 132/84   Wt Readings from Last 3 Encounters:  04/15/24 (!) 305 lb 3.2 oz (138.4 kg)  02/27/24 (!) 303 lb (137.4 kg)  12/04/23 (!) 301 lb 12.8 oz (136.9 kg)      Physical Exam Vitals and nursing note reviewed.  Constitutional:      Appearance: Normal appearance. She is obese.  HENT:     Head: Normocephalic and atraumatic.     Right Ear: Tympanic membrane, ear canal and external ear normal.     Left Ear: Tympanic membrane, ear canal and external ear normal.     Nose: Nose normal.     Mouth/Throat:     Mouth: Mucous membranes are moist.     Pharynx: Oropharynx is clear.  Eyes:     Extraocular Movements: Extraocular movements intact.     Conjunctiva/sclera: Conjunctivae normal.     Pupils: Pupils are equal, round, and reactive to light.  Cardiovascular:     Rate and Rhythm: Normal rate and regular rhythm.     Pulses: Normal pulses.     Heart sounds: Normal heart sounds.  Pulmonary:     Effort: Pulmonary effort is normal.     Breath sounds: Normal breath sounds.  Abdominal:     General: Bowel sounds are normal.     Palpations: Abdomen is  soft.  Musculoskeletal:        General: Normal range of motion.     Cervical back: Normal range of motion and neck supple.  Skin:    General: Skin is warm and dry.     Capillary Refill: Capillary refill takes less than 2 seconds.  Neurological:     General: No focal deficit present.     Mental Status: She is alert and oriented to person, place, and time. Mental status is at baseline.  Psychiatric:        Mood and Affect: Mood normal.        Behavior: Behavior normal.        Thought Content: Thought content normal.        Judgment: Judgment normal.       No results found for any visits on 04/15/24. Last CBC Lab Results  Component Value Date   WBC 4.6 02/27/2024   HGB 13.3 02/27/2024   HCT 40.5 02/27/2024   MCV 98.1 02/27/2024   MCH 32.2 02/27/2024   RDW 13.2 02/27/2024   PLT 312 02/27/2024   Last metabolic panel Lab Results  Component Value Date   GLUCOSE 74 02/27/2024   NA 140 02/27/2024   K 3.3 (L) 02/27/2024   CL 106 02/27/2024   CO2 30 02/27/2024   BUN 14 02/27/2024   CREATININE 0.81 02/27/2024   GFRNONAA >60 02/27/2024   CALCIUM 8.9 02/27/2024   PROT 6.6 08/28/2023   ALBUMIN  3.3 (L) 09/23/2016   BILITOT 0.3 08/28/2023   ALKPHOS 56 09/23/2016   AST 17 08/28/2023   ALT 15 08/28/2023   ANIONGAP 4 (L) 02/27/2024   Last lipids Lab Results  Component Value Date   CHOL 192 08/28/2023   HDL 77 08/28/2023   LDLCALC 101 (H) 08/28/2023   TRIG 53 08/28/2023   CHOLHDL 2.5 08/28/2023   Last hemoglobin A1c Lab Results  Component Value Date   HGBA1C 5.9 (H) 12/01/2022   Last thyroid functions Lab Results  Component Value Date   TSH  2.46 12/01/2022   Last vitamin D  Lab Results  Component Value Date   VD25OH 33 08/28/2023   Last vitamin B12 and Folate No results found for: VITAMINB12, FOLATE       Assessment & Plan:    Routine Health Maintenance and Physical Exam Immunization History  Administered Date(s) Administered   HPV 9-valent  02/05/2019, 04/01/2019, 08/19/2019   HPV Quadrivalent 03/29/2019   Hepatitis B 03/31/2023   Hepatitis B, ADULT 03/31/2023   Hepb-cpg 04/15/2024   PFIZER(Purple Top)SARS-COV-2 Vaccination 06/15/2019, 07/13/2019, 03/14/2020   Pfizer(Comirnaty)Fall Seasonal Vaccine 12 years and older 03/14/2020, 03/30/2022, 03/31/2023   Tdap 07/27/2016   Unspecified SARS-COV-2 Vaccination 03/30/2022    Health Maintenance  Topic Date Due   COVID-19 Vaccine (7 - 2025-26 season) 05/01/2024 (Originally 11/27/2023)   Influenza Vaccine  06/25/2024 (Originally 10/27/2023)   Pneumococcal Vaccine (1 of 2 - PCV) 08/27/2024 (Originally 06/30/2000)   Cervical Cancer Screening (HPV/Pap Cotest)  04/15/2025 (Originally 02/05/2024)   Hepatitis B Vaccines 19-59 Average Risk (3 of 3 - 19+ 3-dose series) 06/10/2024   Mammogram  01/25/2026   DTaP/Tdap/Td (2 - Td or Tdap) 07/28/2026   HPV VACCINES  Completed   Hepatitis C Screening  Completed   HIV Screening  Completed   Meningococcal B Vaccine  Aged Out    Discussed health benefits of physical activity, and encouraged her to engage in regular exercise appropriate for her age and condition.  Problem List Items Addressed This Visit       Cardiovascular and Mediastinum   Essential hypertension   Relevant Orders   CBC with Differential/Platelet   Comprehensive metabolic panel with GFR   Lipid panel   Hemoglobin A1c   TSH     Respiratory   Asthma   OSA (obstructive sleep apnea)     Other   Hyperlipidemia   Relevant Orders   Lipid panel   Morbid obesity with BMI of 50.0-59.9, adult (HCC)   Relevant Medications   metFORMIN  (GLUCOPHAGE -XR) 500 MG 24 hr tablet   Other Relevant Orders   CBC with Differential/Platelet   Comprehensive metabolic panel with GFR   Lipid panel   Hemoglobin A1c   VITAMIN D  25 Hydroxy (Vit-D Deficiency, Fractures)   TSH   Physical exam, annual - Primary   Relevant Orders   CBC with Differential/Platelet   Comprehensive metabolic panel  with GFR   Lipid panel   Hemoglobin A1c   VITAMIN D  25 Hydroxy (Vit-D Deficiency, Fractures)   TSH   Vitamin D  deficiency   Relevant Orders   VITAMIN D  25 Hydroxy (Vit-D Deficiency, Fractures)   Other Visit Diagnoses       Need for vaccination       Relevant Orders   Heplisav-B  (HepB-CPG) Vaccine (Completed)       Assessment and Plan Assessment & Plan Morbid obesity Weight remains above 300 lbs despite increased physical activity and medication. Metformin  increased to 1000 mg daily, but weight loss is not significant. Zepbound  is effective in preventing weight gain but is costly. She is considering alternative formulations due to cost and insurance coverage issues. - Continue Zepbound  15 mg weekly as long as affordable. - Continue metformin  1000 mg daily. - Discussed alternative formulations of Zepbound  due to cost and insurance coverage issues.  Essential hypertension Blood pressure is well-controlled with current medication regimen. Home monitoring shows normal readings, though she experiences occasional dizziness. - Continue current antihypertensive regimen including olmesartan  40 mg daily and chlorthalidone  12.5 mg daily. - Continue home blood pressure  monitoring.  Obstructive sleep apnea Well-managed with CPAP therapy. - Continue CPAP therapy.  Mild intermittent asthma Asthma is well-controlled with infrequent use of albuterol  inhaler, approximately once a month. - Continue albuterol  inhaler as needed.  Vitamin D  deficiency Managed with daily supplementation. Levels will be rechecked with current blood work. - Continue vitamin D  1000 units daily. - Ordered vitamin D  level with blood work.  General Health Maintenance - Mammogram and PAP up to date with GYN. - Health maintenance items up to date. - Discussed vaccine maintenance. - Recommend 150 minutes of moderate intensity exercise weekly and consuming a well-balanced diet.     Return in about 6 months (around  10/13/2024) for chronic follow-up with labs 1 week prior.    Jeoffrey GORMAN Barrio, FNP [facility]       [1]  Social History Tobacco Use   Smoking status: Never   Smokeless tobacco: Never   Tobacco comments:    Never smoker  Vaping Use   Vaping status: Never Used  Substance Use Topics   Alcohol use: Yes    Alcohol/week: 1.0 standard drink of alcohol    Types: 1 Glasses of wine per week    Comment: occ   Drug use: Never  [2]  Allergies Allergen Reactions   Penicillins Hives and Nausea And Vomiting    Has patient had a PCN reaction causing immediate rash, facial/tongue/throat swelling, SOB or lightheadedness with hypotension: No Has patient had a PCN reaction causing severe rash involving mucus membranes or skin necrosis: Yes Has patient had a PCN reaction that required hospitalization No Has patient had a PCN reaction occurring within the last 10 years: No If all of the above answers are NO, then may proceed with Cephalosporin use.    Latex Rash and Dermatitis   "

## 2024-04-16 LAB — CBC WITH DIFFERENTIAL/PLATELET
Absolute Lymphocytes: 1578 {cells}/uL (ref 850–3900)
Absolute Monocytes: 340 {cells}/uL (ref 200–950)
Basophils Absolute: 51 {cells}/uL (ref 0–200)
Basophils Relative: 1.1 %
Eosinophils Absolute: 101 {cells}/uL (ref 15–500)
Eosinophils Relative: 2.2 %
HCT: 36.1 % (ref 35.9–46.0)
Hemoglobin: 12.2 g/dL (ref 11.7–15.5)
MCH: 32.4 pg (ref 27.0–33.0)
MCHC: 33.8 g/dL (ref 31.6–35.4)
MCV: 95.8 fL (ref 81.4–101.7)
MPV: 10.3 fL (ref 7.5–12.5)
Monocytes Relative: 7.4 %
Neutro Abs: 2530 {cells}/uL (ref 1500–7800)
Neutrophils Relative %: 55 %
Platelets: 320 Thousand/uL (ref 140–400)
RBC: 3.77 Million/uL — ABNORMAL LOW (ref 3.80–5.10)
RDW: 13 % (ref 11.0–15.0)
Total Lymphocyte: 34.3 %
WBC: 4.6 Thousand/uL (ref 3.8–10.8)

## 2024-04-16 LAB — HEMOGLOBIN A1C
Hgb A1c MFr Bld: 5.4 %
Mean Plasma Glucose: 108 mg/dL
eAG (mmol/L): 6 mmol/L

## 2024-04-16 LAB — COMPREHENSIVE METABOLIC PANEL WITH GFR
AG Ratio: 1.7 (calc) (ref 1.0–2.5)
ALT: 15 U/L (ref 6–29)
AST: 17 U/L (ref 10–30)
Albumin: 4.3 g/dL (ref 3.6–5.1)
Alkaline phosphatase (APISO): 60 U/L (ref 31–125)
BUN: 19 mg/dL (ref 7–25)
CO2: 30 mmol/L (ref 20–32)
Calcium: 9.2 mg/dL (ref 8.6–10.2)
Chloride: 102 mmol/L (ref 98–110)
Creat: 0.78 mg/dL (ref 0.50–0.99)
Globulin: 2.6 g/dL (ref 1.9–3.7)
Glucose, Bld: 91 mg/dL (ref 65–99)
Potassium: 3.9 mmol/L (ref 3.5–5.3)
Sodium: 138 mmol/L (ref 135–146)
Total Bilirubin: 0.4 mg/dL (ref 0.2–1.2)
Total Protein: 6.9 g/dL (ref 6.1–8.1)
eGFR: 97 mL/min/1.73m2

## 2024-04-16 LAB — LIPID PANEL
Cholesterol: 207 mg/dL — ABNORMAL HIGH
HDL: 82 mg/dL
LDL Cholesterol (Calc): 109 mg/dL — ABNORMAL HIGH
Non-HDL Cholesterol (Calc): 125 mg/dL
Total CHOL/HDL Ratio: 2.5 (calc)
Triglycerides: 69 mg/dL

## 2024-04-16 LAB — VITAMIN D 25 HYDROXY (VIT D DEFICIENCY, FRACTURES): Vit D, 25-Hydroxy: 41 ng/mL (ref 30–100)

## 2024-04-16 LAB — TSH: TSH: 2.39 m[IU]/L

## 2024-04-17 ENCOUNTER — Ambulatory Visit: Payer: Self-pay | Admitting: Family Medicine

## 2024-04-25 ENCOUNTER — Encounter: Payer: Self-pay | Admitting: Family Medicine

## 2024-04-25 ENCOUNTER — Other Ambulatory Visit: Payer: Self-pay | Admitting: Family Medicine

## 2024-04-25 MED ORDER — WEGOVY 25 MG PO TABS: 25.0000 mg | ORAL_TABLET | Freq: Every day | ORAL | 0 refills | Status: AC

## 2024-07-16 ENCOUNTER — Ambulatory Visit

## 2024-10-07 ENCOUNTER — Other Ambulatory Visit

## 2024-10-14 ENCOUNTER — Ambulatory Visit: Admitting: Family Medicine
